# Patient Record
Sex: Female | Born: 1975 | Race: Black or African American | Hispanic: No | Marital: Married | Smoking: Never smoker
Health system: Southern US, Community
[De-identification: ages and names within clinical notes are randomized; demographics above are authoritative.]

## PROBLEM LIST (undated history)

## (undated) ENCOUNTER — Inpatient Hospital Stay (HOSPITAL_COMMUNITY): Payer: Self-pay

## (undated) DIAGNOSIS — K219 Gastro-esophageal reflux disease without esophagitis: Secondary | ICD-10-CM

## (undated) DIAGNOSIS — D649 Anemia, unspecified: Secondary | ICD-10-CM

## (undated) DIAGNOSIS — D219 Benign neoplasm of connective and other soft tissue, unspecified: Secondary | ICD-10-CM

## (undated) DIAGNOSIS — R51 Headache: Secondary | ICD-10-CM

## (undated) DIAGNOSIS — R519 Headache, unspecified: Secondary | ICD-10-CM

## (undated) HISTORY — PX: ABDOMINAL HYSTERECTOMY: SHX81

## (undated) HISTORY — DX: Headache, unspecified: R51.9

## (undated) HISTORY — PX: WISDOM TOOTH EXTRACTION: SHX21

## (undated) HISTORY — DX: Headache: R51

## (undated) HISTORY — PX: NO PAST SURGERIES: SHX2092

---

## 2001-05-12 ENCOUNTER — Other Ambulatory Visit: Admission: RE | Admit: 2001-05-12 | Discharge: 2001-05-12 | Payer: Self-pay | Admitting: Family Medicine

## 2003-05-02 ENCOUNTER — Other Ambulatory Visit: Admission: RE | Admit: 2003-05-02 | Discharge: 2003-05-02 | Payer: Self-pay | Admitting: Obstetrics & Gynecology

## 2004-06-17 ENCOUNTER — Ambulatory Visit: Payer: Self-pay | Admitting: Family Medicine

## 2004-07-08 ENCOUNTER — Ambulatory Visit: Payer: Self-pay | Admitting: Family Medicine

## 2004-12-26 ENCOUNTER — Emergency Department: Payer: Self-pay | Admitting: Unknown Physician Specialty

## 2005-12-04 ENCOUNTER — Emergency Department (HOSPITAL_COMMUNITY): Admission: EM | Admit: 2005-12-04 | Discharge: 2005-12-04 | Payer: Self-pay | Admitting: Family Medicine

## 2008-04-13 ENCOUNTER — Ambulatory Visit: Payer: Self-pay | Admitting: Gastroenterology

## 2008-04-16 ENCOUNTER — Telehealth: Payer: Self-pay | Admitting: Gastroenterology

## 2009-06-12 ENCOUNTER — Ambulatory Visit: Payer: Self-pay | Admitting: Family Medicine

## 2009-11-25 ENCOUNTER — Emergency Department: Payer: Self-pay | Admitting: Emergency Medicine

## 2012-07-16 ENCOUNTER — Ambulatory Visit: Payer: Self-pay | Admitting: Internal Medicine

## 2013-07-08 ENCOUNTER — Encounter (HOSPITAL_COMMUNITY): Payer: Self-pay | Admitting: *Deleted

## 2013-07-08 ENCOUNTER — Inpatient Hospital Stay (HOSPITAL_COMMUNITY)
Admission: AD | Admit: 2013-07-08 | Discharge: 2013-07-08 | Disposition: A | Discharge status: Home / Self Care | Payer: 59 | Attending: Obstetrics & Gynecology | Admitting: Obstetrics & Gynecology

## 2013-07-08 DIAGNOSIS — O200 Threatened abortion: Secondary | ICD-10-CM | POA: Diagnosis present

## 2013-07-08 HISTORY — DX: Anemia, unspecified: D64.9

## 2013-07-08 LAB — POCT PREGNANCY, URINE: Preg Test, Ur: POSITIVE — AB

## 2013-07-08 LAB — URINALYSIS, ROUTINE W REFLEX MICROSCOPIC
Ketones, ur: NEGATIVE mg/dL
Leukocytes, UA: NEGATIVE
Nitrite: NEGATIVE
Protein, ur: NEGATIVE mg/dL
Specific Gravity, Urine: 1.025 (ref 1.005–1.030)
pH: 5.5 (ref 5.0–8.0)

## 2013-07-08 LAB — HCG, QUANTITATIVE, PREGNANCY: hCG, Beta Chain, Quant, S: 85 m[IU]/mL — ABNORMAL HIGH (ref <5)

## 2013-07-08 LAB — ABO/RH: ABO/RH(D): O POS

## 2013-07-08 LAB — URINE MICROSCOPIC-ADD ON

## 2013-07-08 MED ORDER — ACETAMINOPHEN 500 MG PO TABS
1000.0000 mg | ORAL_TABLET | ORAL | Status: AC
  Administered 2013-07-08: 1000 mg via ORAL
  Filled 2013-07-08: qty 2

## 2013-07-08 NOTE — MAU Provider Note (Signed)
  History     CSN: [id number]  Arrival date and time: 07/08/13 0909 RN call at 0959 CNM and SNM to see pt at 1015   None     Chief Complaint  Patient presents with  . Vaginal Bleeding   HPI  Brown spotting last night.   BRB with cramping starting at 0230.  Last intercourse 3 days ago.  Confirmed SPT at [facility] last week.    Past Medical History  Diagnosis Date  . Anemia     Past Surgical History  Procedure Laterality Date  . No past surgeries      Family History  Problem Relation Age of Onset  . Hypertension Father   . Diabetes Sister   . Diabetes Maternal Grandmother   . Diabetes Maternal Grandfather   . Diabetes Paternal Grandmother   . Hypertension Paternal Grandmother   . Diabetes Paternal Grandfather     History  Substance Use Topics  . Smoking status: Never Smoker   . Smokeless tobacco: Not on file  . Alcohol Use: No    Allergies: No Known Allergies  Prescriptions prior to admission  Medication Sig Dispense Refill  . ibuprofen (ADVIL,MOTRIN) 200 MG tablet Take 200 mg by mouth every 6 (six) hours as needed for pain.      . Prenatal Vit-Fe Fumarate-FA (PRENATAL MULTIVITAMIN) TABS tablet Take 1 tablet by mouth daily at 12 noon.        ROS Pt feeling ko.  Not dizzy or light headed.   Vaginal bleeding--Brown transitioned to BRB this am with clots.  No abd pain but reports menstrual cramping.   No N/V, urinary pain. Not taken anything for pain. Physical Exam   Blood pressure 139/91, pulse 72, temperature 98.6 F (37 C), temperature source Oral, resp. rate 18.  Physical Exam A&Ox3 Abd: Soft, nontender, nondistended Vaginal: Moderate amt of BRB in vaginal vault  Uterus: No enlargement, mildly tender Cervix: Os slightly open, active bleeding Adnexa: Nontender, no masses   MAU Course  Procedures Quant--85 Type and Rh--O+   Assessment and Plan  First trimester bleeding consistent with Threatened AB  Discussed SAB, anticipatory guidance,  analgesia for cramping and signs to call clinic.  F/U Monday at 0800 for repeat quant.   Cyndee Brightly 07/08/2013, 10:19 AM

## 2013-07-08 NOTE — MAU Provider Note (Signed)
Seen with student - agree with exam and management.  Marlinda Mike CNM MSN [facility]

## 2013-07-08 NOTE — MAU Note (Signed)
C/o spotting yesterday (brownish and pinkish) @ 1700; c/o today of small red bleeding;

## 2013-11-20 ENCOUNTER — Ambulatory Visit: Payer: Self-pay | Admitting: Gastroenterology

## 2013-11-27 LAB — PATHOLOGY REPORT

## 2014-03-05 ENCOUNTER — Encounter (HOSPITAL_COMMUNITY): Payer: Self-pay

## 2014-04-02 ENCOUNTER — Encounter: Payer: Self-pay | Admitting: Internal Medicine

## 2014-04-02 ENCOUNTER — Ambulatory Visit (INDEPENDENT_AMBULATORY_CARE_PROVIDER_SITE_OTHER): Payer: 59 | Admitting: Internal Medicine

## 2014-04-02 VITALS — BP 114/76 | HR 86 | Temp 98.1°F | Ht 67.75 in | Wt 238.0 lb

## 2014-04-02 DIAGNOSIS — Z0000 Encounter for general adult medical examination without abnormal findings: Secondary | ICD-10-CM

## 2014-04-02 DIAGNOSIS — E669 Obesity, unspecified: Secondary | ICD-10-CM | POA: Insufficient documentation

## 2014-04-02 NOTE — Progress Notes (Signed)
HPI  Pt presents to the clinic today to establish care. She has not had a PCP in many years. She has no concerns today.  Flu: never Tetanus: less than 10 years ago LMP: 03/15/14 Pap Smear: 12/2012 Dentist: biannually  Past Medical History  Diagnosis Date  . Anemia   . Frequent headaches     Current Outpatient Prescriptions  Medication Sig Dispense Refill  . ibuprofen (ADVIL,MOTRIN) 200 MG tablet Take 200 mg by mouth every 6 (six) hours as needed for pain.       No current facility-administered medications for this visit.    No Known Allergies  Family History  Problem Relation Age of Onset  . Hypertension Father   . Diabetes Sister   . Hypertension Sister   . Diabetes Maternal Grandmother   . Diabetes Maternal Grandfather   . Diabetes Paternal Grandmother   . Hypertension Paternal Grandmother   . Diabetes Paternal Grandfather   . Hypertension Paternal Grandfather     History   Social History  . Marital Status: Single    Spouse Name: N/A    Number of Children: N/A  . Years of Education: N/A   Occupational History  . Not on file.   Social History Main Topics  . Smoking status: Never Smoker   . Smokeless tobacco: Never Used  . Alcohol Use: Yes     Comment: occasional  . Drug Use: No  . Sexual Activity: Not on file   Other Topics Concern  . Not on file   Social History Narrative  . No narrative on file    ROS:  Constitutional: Denies fever, malaise, fatigue, headache or abrupt weight changes.  HEENT: Denies eye pain, eye redness, ear pain, ringing in the ears, wax buildup, runny nose, nasal congestion, bloody nose, or sore throat. Respiratory: Denies difficulty breathing, shortness of breath, cough or sputum production.   Cardiovascular: Denies chest pain, chest tightness, palpitations or swelling in the hands or feet.  Gastrointestinal: Pt reports constipation. Denies abdominal pain, bloating, diarrhea or blood in the stool.  GU: Pt reports clots with  periods. Denies frequency, urgency, pain with urination, blood in urine, odor or discharge. Musculoskeletal: Denies decrease in range of motion, difficulty with gait, muscle pain or joint pain and swelling.  Skin: Denies redness, rashes, lesions or ulcercations.  Neurological: Denies dizziness, difficulty with memory, difficulty with speech or problems with balance and coordination.   No other specific complaints in a complete review of systems (except as listed in HPI above).  PE:  BP 114/76  Pulse 86  Temp(Src) 98.1 F (36.7 C) (Oral)  Ht 5' 7.75" (1.721 m)  Wt 238 lb (107.956 kg)  BMI 36.45 kg/m2  SpO2 99%  LMP 03/15/2014  Breastfeeding? Unknown Wt Readings from Last 3 Encounters:  04/02/14 238 lb (107.956 kg)    General: Appears her stated age, obese but  well developed, well nourished in NAD. HEENT: Head: normal shape and size; Eyes: sclera white, no icterus, conjunctiva pink, PERRLA and EOMs intact; Ears: Tm's gray and intact, normal light reflex; Nose: mucosa pink and moist, septum midline; Throat/Mouth: Teeth present, mucosa pink and moist, no lesions or ulcerations noted.  Neck: Normal range of motion. Neck supple, trachea midline. No massses, lumps or thyromegaly present.  Cardiovascular: Normal rate and rhythm. S1,S2 noted.  No murmur, rubs or gallops noted. No JVD or BLE edema. No carotid bruits noted. Pulmonary/Chest: Normal effort and positive vesicular breath sounds. No respiratory distress. No wheezes, rales or ronchi  noted.  Abdomen: Soft and nontender. Normal bowel sounds, no bruits noted. No distention or masses noted. Liver, spleen and kidneys non palpable. Musculoskeletal: Normal range of motion. No signs of joint swelling. No difficulty with gait.  Neurological: Alert and oriented. Cranial nerves II-XII intact. Coordination normal. +DTRs bilaterally. Psychiatric: Mood and affect normal. Behavior is normal. Judgment and thought content normal.    Assessment and  Plan:  Preventative Health Maintenace:  Encouraged her to work on diet and exercise Will check basic labs today Discussed going to gyn to r/o fibroids- she is not in any pain at this time so she will hold off for now  RTC in 1 year or sooner if needed

## 2014-04-02 NOTE — Patient Instructions (Addendum)
Preventive Care for Adults A healthy lifestyle and preventive care can promote health and wellness. Preventive health guidelines for women include the following key practices.  A routine yearly physical is a good way to check with your health care provider about your health and preventive screening. It is a chance to share any concerns and updates on your health and to receive a thorough exam.  Visit your dentist for a routine exam and preventive care every 6 months. Brush your teeth twice a day and floss once a day. Good oral hygiene prevents tooth decay and gum disease.  The frequency of eye exams is based on your age, health, family medical history, use of contact lenses, and other factors. Follow your health care provider's recommendations for frequency of eye exams.  Eat a healthy diet. Foods like vegetables, fruits, whole grains, low-fat dairy products, and lean protein foods contain the nutrients you need without too many calories. Decrease your intake of foods high in solid fats, added sugars, and salt. Eat the right amount of calories for you.Get information about a proper diet from your health care provider, if necessary.  Regular physical exercise is one of the most important things you can do for your health. Most adults should get at least 150 minutes of moderate-intensity exercise (any activity that increases your heart rate and causes you to sweat) each week. In addition, most adults need muscle-strengthening exercises on 2 or more days a week.  Maintain a healthy weight. The body mass index (BMI) is a screening tool to identify possible weight problems. It provides an estimate of body fat based on height and weight. Your health care provider can find your BMI, and can help you achieve or maintain a healthy weight.For adults 20 years and older:  A BMI below 18.5 is considered underweight.  A BMI of 18.5 to 24.9 is normal.  A BMI of 25 to 29.9 is considered overweight.  A BMI of  30 and above is considered obese.  Maintain normal blood lipids and cholesterol levels by exercising and minimizing your intake of saturated fat. Eat a balanced diet with plenty of fruit and vegetables. Blood tests for lipids and cholesterol should begin at age 52 and be repeated every 5 years. If your lipid or cholesterol levels are high, you are over 50, or you are at high risk for heart disease, you may need your cholesterol levels checked more frequently.Ongoing high lipid and cholesterol levels should be treated with medicines if diet and exercise are not working.  If you smoke, find out from your health care provider how to quit. If you do not use tobacco, do not start.  Lung cancer screening is recommended for adults aged 37-80 years who are at high risk for developing lung cancer because of a history of smoking. A yearly low-dose CT scan of the lungs is recommended for people who have at least a 30-pack-year history of smoking and are a current smoker or have quit within the past 15 years. A pack year of smoking is smoking an average of 1 pack of cigarettes a day for 1 year (for example: 1 pack a day for 30 years or 2 packs a day for 15 years). Yearly screening should continue until the smoker has stopped smoking for at least 15 years. Yearly screening should be stopped for people who develop a health problem that would prevent them from having lung cancer treatment.  If you are pregnant, do not drink alcohol. If you are breastfeeding,  be very cautious about drinking alcohol. If you are not pregnant and choose to drink alcohol, do not have more than 1 drink per day. One drink is considered to be 12 ounces (355 mL) of beer, 5 ounces (148 mL) of wine, or 1.5 ounces (44 mL) of liquor.  Avoid use of street drugs. Do not share needles with anyone. Ask for help if you need support or instructions about stopping the use of drugs.  High blood pressure causes heart disease and increases the risk of  stroke. Your blood pressure should be checked at least every 1 to 2 years. Ongoing high blood pressure should be treated with medicines if weight loss and exercise do not work.  If you are 3-86 years old, ask your health care provider if you should take aspirin to prevent strokes.  Diabetes screening involves taking a blood sample to check your fasting blood sugar level. This should be done once every 3 years, after age 67, if you are within normal weight and without risk factors for diabetes. Testing should be considered at a younger age or be carried out more frequently if you are overweight and have at least 1 risk factor for diabetes.  Breast cancer screening is essential preventive care for women. You should practice "breast self-awareness." This means understanding the normal appearance and feel of your breasts and may include breast self-examination. Any changes detected, no matter how small, should be reported to a health care provider. Women in their 8s and 30s should have a clinical breast exam (CBE) by a health care provider as part of a regular health exam every 1 to 3 years. After age 70, women should have a CBE every year. Starting at age 25, women should consider having a mammogram (breast X-ray test) every year. Women who have a family history of breast cancer should talk to their health care provider about genetic screening. Women at a high risk of breast cancer should talk to their health care providers about having an MRI and a mammogram every year.  Breast cancer gene (BRCA)-related cancer risk assessment is recommended for women who have family members with BRCA-related cancers. BRCA-related cancers include breast, ovarian, tubal, and peritoneal cancers. Having family members with these cancers may be associated with an increased risk for harmful changes (mutations) in the breast cancer genes BRCA1 and BRCA2. Results of the assessment will determine the need for genetic counseling and  BRCA1 and BRCA2 testing.  Routine pelvic exams to screen for cancer are no longer recommended for nonpregnant women who are considered low risk for cancer of the pelvic organs (ovaries, uterus, and vagina) and who do not have symptoms. Ask your health care provider if a screening pelvic exam is right for you.  If you have had past treatment for cervical cancer or a condition that could lead to cancer, you need Pap tests and screening for cancer for at least 20 years after your treatment. If Pap tests have been discontinued, your risk factors (such as having a new sexual partner) need to be reassessed to determine if screening should be resumed. Some women have medical problems that increase the chance of getting cervical cancer. In these cases, your health care provider may recommend more frequent screening and Pap tests.  The HPV test is an additional test that may be used for cervical cancer screening. The HPV test looks for the virus that can cause the cell changes on the cervix. The cells collected during the Pap test can be  tested for HPV. The HPV test could be used to screen women aged 47 years and older, and should be used in women of any age who have unclear Pap test results. After the age of 36, women should have HPV testing at the same frequency as a Pap test.  Colorectal cancer can be detected and often prevented. Most routine colorectal cancer screening begins at the age of 38 years and continues through age 58 years. However, your health care provider may recommend screening at an earlier age if you have risk factors for colon cancer. On a yearly basis, your health care provider may provide home test kits to check for hidden blood in the stool. Use of a small camera at the end of a tube, to directly examine the colon (sigmoidoscopy or colonoscopy), can detect the earliest forms of colorectal cancer. Talk to your health care provider about this at age 64, when routine screening begins. Direct  exam of the colon should be repeated every 5-10 years through age 21 years, unless early forms of pre-cancerous polyps or small growths are found.  People who are at an increased risk for hepatitis B should be screened for this virus. You are considered at high risk for hepatitis B if:  You were born in a country where hepatitis B occurs often. Talk with your health care provider about which countries are considered high risk.  Your parents were born in a high-risk country and you have not received a shot to protect against hepatitis B (hepatitis B vaccine).  You have HIV or AIDS.  You use needles to inject street drugs.  You live with, or have sex with, someone who has Hepatitis B.  You get hemodialysis treatment.  You take certain medicines for conditions like cancer, organ transplantation, and autoimmune conditions.  Hepatitis C blood testing is recommended for all people born from [birth year] through 1965 and any individual with known risks for hepatitis C.  Practice safe sex. Use condoms and avoid high-risk sexual practices to reduce the spread of sexually transmitted infections (STIs). STIs include gonorrhea, chlamydia, syphilis, trichomonas, herpes, HPV, and human immunodeficiency virus (HIV). Herpes, HIV, and HPV are viral illnesses that have no cure. They can result in disability, cancer, and death.  You should be screened for sexually transmitted illnesses (STIs) including gonorrhea and chlamydia if:  You are sexually active and are younger than 24 years.  You are older than 24 years and your health care provider tells you that you are at risk for this type of infection.  Your sexual activity has changed since you were last screened and you are at an increased risk for chlamydia or gonorrhea. Ask your health care provider if you are at risk.  If you are at risk of being infected with HIV, it is recommended that you take a prescription medicine daily to prevent HIV infection. This is  called preexposure prophylaxis (PrEP). You are considered at risk if:  You are a heterosexual woman, are sexually active, and are at increased risk for HIV infection.  You take drugs by injection.  You are sexually active with a partner who has HIV.  Talk with your health care provider about whether you are at high risk of being infected with HIV. If you choose to begin PrEP, you should first be tested for HIV. You should then be tested every 3 months for as long as you are taking PrEP.  Osteoporosis is a disease in which the bones lose minerals and strength  with aging. This can result in serious bone fractures or breaks. The risk of osteoporosis can be identified using a bone density scan. Women ages 65 years and over and women at risk for fractures or osteoporosis should discuss screening with their health care providers. Ask your health care provider whether you should take a calcium supplement or vitamin D to reduce the rate of osteoporosis.  Menopause can be associated with physical symptoms and risks. Hormone replacement therapy is available to decrease symptoms and risks. You should talk to your health care provider about whether hormone replacement therapy is right for you.  Use sunscreen. Apply sunscreen liberally and repeatedly throughout the day. You should seek shade when your shadow is shorter than you. Protect yourself by wearing long sleeves, pants, a wide-brimmed hat, and sunglasses year round, whenever you are outdoors.  Once a month, do a whole body skin exam, using a mirror to look at the skin on your back. Tell your health care provider of new moles, moles that have irregular borders, moles that are larger than a pencil eraser, or moles that have changed in shape or color.  Stay current with required vaccines (immunizations).  Influenza vaccine. All adults should be immunized every year.  Tetanus, diphtheria, and acellular pertussis (Td, Tdap) vaccine. Pregnant women should  receive 1 dose of Tdap vaccine during each pregnancy. The dose should be obtained regardless of the length of time since the last dose. Immunization is preferred during the 27th-36th week of gestation. An adult who has not previously received Tdap or who does not know her vaccine status should receive 1 dose of Tdap. This initial dose should be followed by tetanus and diphtheria toxoids (Td) booster doses every 10 years. Adults with an unknown or incomplete history of completing a 3-dose immunization series with Td-containing vaccines should begin or complete a primary immunization series including a Tdap dose. Adults should receive a Td booster every 10 years.  Varicella vaccine. An adult without evidence of immunity to varicella should receive 2 doses or a second dose if she has previously received 1 dose. Pregnant females who do not have evidence of immunity should receive the first dose after pregnancy. This first dose should be obtained before leaving the health care facility. The second dose should be obtained 4-8 weeks after the first dose.  Human papillomavirus (HPV) vaccine. Females aged 13-26 years who have not received the vaccine previously should obtain the 3-dose series. The vaccine is not recommended for use in pregnant females. However, pregnancy testing is not needed before receiving a dose. If a female is found to be pregnant after receiving a dose, no treatment is needed. In that case, the remaining doses should be delayed until after the pregnancy. Immunization is recommended for any person with an immunocompromised condition through the age of 26 years if she did not get any or all doses earlier. During the 3-dose series, the second dose should be obtained 4-8 weeks after the first dose. The third dose should be obtained 24 weeks after the first dose and 16 weeks after the second dose.  Zoster vaccine. One dose is recommended for adults aged 60 years or older unless certain conditions are  present.  Measles, mumps, and rubella (MMR) vaccine. Adults born before 1957 generally are considered immune to measles and mumps. Adults born in 1957 or later should have 1 or more doses of MMR vaccine unless there is a contraindication to the vaccine or there is laboratory evidence of immunity to   each of the three diseases. A routine second dose of MMR vaccine should be obtained at least 28 days after the first dose for students attending postsecondary schools, health care workers, or international travelers. People who received inactivated measles vaccine or an unknown type of measles vaccine during 1963-1967 should receive 2 doses of MMR vaccine. People who received inactivated mumps vaccine or an unknown type of mumps vaccine before 1979 and are at high risk for mumps infection should consider immunization with 2 doses of MMR vaccine. For females of childbearing age, rubella immunity should be determined. If there is no evidence of immunity, females who are not pregnant should be vaccinated. If there is no evidence of immunity, females who are pregnant should delay immunization until after pregnancy. Unvaccinated health care workers born before 1957 who lack laboratory evidence of measles, mumps, or rubella immunity or laboratory confirmation of disease should consider measles and mumps immunization with 2 doses of MMR vaccine or rubella immunization with 1 dose of MMR vaccine.  Pneumococcal 13-valent conjugate (PCV13) vaccine. When indicated, a person who is uncertain of her immunization history and has no record of immunization should receive the PCV13 vaccine. An adult aged 19 years or older who has certain medical conditions and has not been previously immunized should receive 1 dose of PCV13 vaccine. This PCV13 should be followed with a dose of pneumococcal polysaccharide (PPSV23) vaccine. The PPSV23 vaccine dose should be obtained at least 8 weeks after the dose of PCV13 vaccine. An adult aged 19  years or older who has certain medical conditions and previously received 1 or more doses of PPSV23 vaccine should receive 1 dose of PCV13. The PCV13 vaccine dose should be obtained 1 or more years after the last PPSV23 vaccine dose.  Pneumococcal polysaccharide (PPSV23) vaccine. When PCV13 is also indicated, PCV13 should be obtained first. All adults aged 65 years and older should be immunized. An adult younger than age 65 years who has certain medical conditions should be immunized. Any person who resides in a nursing home or long-term care facility should be immunized. An adult smoker should be immunized. People with an immunocompromised condition and certain other conditions should receive both PCV13 and PPSV23 vaccines. People with human immunodeficiency virus (HIV) infection should be immunized as soon as possible after diagnosis. Immunization during chemotherapy or radiation therapy should be avoided. Routine use of PPSV23 vaccine is not recommended for American Indians, [place] Natives, or people younger than 65 years unless there are medical conditions that require PPSV23 vaccine. When indicated, people who have unknown immunization and have no record of immunization should receive PPSV23 vaccine. One-time revaccination 5 years after the first dose of PPSV23 is recommended for people aged 19-64 years who have chronic kidney failure, nephrotic syndrome, asplenia, or immunocompromised conditions. People who received 1-2 doses of PPSV23 before age 65 years should receive another dose of PPSV23 vaccine at age 65 years or later if at least 5 years have passed since the previous dose. Doses of PPSV23 are not needed for people immunized with PPSV23 at or after age 65 years.  Meningococcal vaccine. Adults with asplenia or persistent complement component deficiencies should receive 2 doses of quadrivalent meningococcal conjugate (MenACWY-D) vaccine. The doses should be obtained at least 2 months apart.  Microbiologists working with certain meningococcal bacteria, military recruits, people at risk during an outbreak, and people who travel to or live in countries with a high rate of meningitis should be immunized. A first-year college student up through age   21 years who is living in a residence hall should receive a dose if she did not receive a dose on or after her 16th birthday. Adults who have certain high-risk conditions should receive one or more doses of vaccine.  Hepatitis A vaccine. Adults who wish to be protected from this disease, have certain high-risk conditions, work with hepatitis A-infected animals, work in hepatitis A research labs, or travel to or work in countries with a high rate of hepatitis A should be immunized. Adults who were previously unvaccinated and who anticipate close contact with an international adoptee during the first 60 days after arrival in the [place] from a country with a high rate of hepatitis A should be immunized.  Hepatitis B vaccine. Adults who wish to be protected from this disease, have certain high-risk conditions, may be exposed to blood or other infectious body fluids, are household contacts or sex partners of hepatitis B positive people, are clients or workers in certain care facilities, or travel to or work in countries with a high rate of hepatitis B should be immunized.  Haemophilus influenzae type b (Hib) vaccine. A previously unvaccinated person with asplenia or sickle cell disease or having a scheduled splenectomy should receive 1 dose of Hib vaccine. Regardless of previous immunization, a recipient of a hematopoietic stem cell transplant should receive a 3-dose series 6-12 months after her successful transplant. Hib vaccine is not recommended for adults with HIV infection. Preventive Services / Frequency Ages 43 to 39years  Blood pressure check.** / Every 1 to 2 years.  Lipid and cholesterol check.** / Every 5 years beginning at age  75.  Clinical breast exam.** / Every 3 years for women in their 32s and 74s.  BRCA-related cancer risk assessment.** / For women who have family members with a BRCA-related cancer (breast, ovarian, tubal, or peritoneal cancers).  Pap test.** / Every 2 years from ages 65 through 91. Every 3 years starting at age 34 through age 93 or 72 with a history of 3 consecutive normal Pap tests.  HPV screening.** / Every 3 years from ages 46 through ages 53 to 26 with a history of 3 consecutive normal Pap tests.  Hepatitis C blood test.** / For any individual with known risks for hepatitis C.  Skin self-exam. / Monthly.  Influenza vaccine. / Every year.  Tetanus, diphtheria, and acellular pertussis (Tdap, Td) vaccine.** / Consult your health care provider. Pregnant women should receive 1 dose of Tdap vaccine during each pregnancy. 1 dose of Td every 10 years.  Varicella vaccine.** / Consult your health care provider. Pregnant females who do not have evidence of immunity should receive the first dose after pregnancy.  HPV vaccine. / 3 doses over 6 months, if 70 and younger. The vaccine is not recommended for use in pregnant females. However, pregnancy testing is not needed before receiving a dose.  Measles, mumps, rubella (MMR) vaccine.** / You need at least 1 dose of MMR if you were born in 1957 or later. You may also need a 2nd dose. For females of childbearing age, rubella immunity should be determined. If there is no evidence of immunity, females who are not pregnant should be vaccinated. If there is no evidence of immunity, females who are pregnant should delay immunization until after pregnancy.  Pneumococcal 13-valent conjugate (PCV13) vaccine.** / Consult your health care provider.  Pneumococcal polysaccharide (PPSV23) vaccine.** / 1 to 2 doses if you smoke cigarettes or if you have certain conditions.  Meningococcal vaccine.** /  1 dose if you are age 70 to 51 years and a [place] living in a residence hall, or have one of several medical conditions, you need to get vaccinated against meningococcal disease. You may also need additional booster doses.  Hepatitis A vaccine.** / Consult your health care provider.  Hepatitis B vaccine.** / Consult your health care provider.  Haemophilus influenzae type b (Hib) vaccine.** / Consult your health care provider. Ages 40 to 64years  Blood pressure check.** / Every 1 to 2 years.  Lipid and cholesterol check.** / Every 5 years beginning at age 58 years.  Lung cancer screening. / Every year if you are aged 56-80 years and have a 30-pack-year history of smoking and currently smoke or have quit within the past 15 years. Yearly screening is stopped once you have quit smoking for at least 15 years or develop a health problem that would prevent you from having lung cancer treatment.  Clinical breast exam.** / Every year after age 35 years.  BRCA-related cancer risk assessment.** / For women who have family members with a BRCA-related cancer (breast, ovarian, tubal, or peritoneal cancers).  Mammogram.** / Every year beginning at age 109 years and continuing for as long as you are in good health. Consult with your health care provider.  Pap test.** / Every 3 years starting at age 44 years through age 94 or 70 years with a history of 3 consecutive normal Pap tests.  HPV screening.** / Every 3 years from ages 109 years through ages 50 to 30 years with a history of 3 consecutive normal Pap tests.  Fecal occult blood test (FOBT) of stool. / Every year beginning at age 73 years and continuing until age 59 years. You may not need to do this test if you get a colonoscopy every 10 years.  Flexible sigmoidoscopy or colonoscopy.** / Every 5 years for a flexible sigmoidoscopy or every 10 years for a colonoscopy beginning at age 68 years and continuing until age 12 years.  Hepatitis C blood test.** / For all people born from [birth year] through  1965 and any individual with known risks for hepatitis C.  Skin self-exam. / Monthly.  Influenza vaccine. / Every year.  Tetanus, diphtheria, and acellular pertussis (Tdap/Td) vaccine.** / Consult your health care provider. Pregnant women should receive 1 dose of Tdap vaccine during each pregnancy. 1 dose of Td every 10 years.  Varicella vaccine.** / Consult your health care provider. Pregnant females who do not have evidence of immunity should receive the first dose after pregnancy.  Zoster vaccine.** / 1 dose for adults aged 2 years or older.  Measles, mumps, rubella (MMR) vaccine.** / You need at least 1 dose of MMR if you were born in 1957 or later. You may also need a 2nd dose. For females of childbearing age, rubella immunity should be determined. If there is no evidence of immunity, females who are not pregnant should be vaccinated. If there is no evidence of immunity, females who are pregnant should delay immunization until after pregnancy.  Pneumococcal 13-valent conjugate (PCV13) vaccine.** / Consult your health care provider.  Pneumococcal polysaccharide (PPSV23) vaccine.** / 1 to 2 doses if you smoke cigarettes or if you have certain conditions.  Meningococcal vaccine.** / Consult your health care provider.  Hepatitis A vaccine.** / Consult your health care provider.  Hepatitis B vaccine.** / Consult your health care provider.  Haemophilus influenzae type b (Hib) vaccine.** / Consult your health care provider. Ages 48 years  and over  Blood pressure check.** / Every 1 to 2 years.  Lipid and cholesterol check.** / Every 5 years beginning at age 84 years.  Lung cancer screening. / Every year if you are aged 50-80 years and have a 30-pack-year history of smoking and currently smoke or have quit within the past 15 years. Yearly screening is stopped once you have quit smoking for at least 15 years or develop a health problem that would prevent you from having lung cancer  treatment.  Clinical breast exam.** / Every year after age 24 years.  BRCA-related cancer risk assessment.** / For women who have family members with a BRCA-related cancer (breast, ovarian, tubal, or peritoneal cancers).  Mammogram.** / Every year beginning at age 14 years and continuing for as long as you are in good health. Consult with your health care provider.  Pap test.** / Every 3 years starting at age 17 years through age 31 or 74 years with 3 consecutive normal Pap tests. Testing can be stopped between 65 and 70 years with 3 consecutive normal Pap tests and no abnormal Pap or HPV tests in the past 10 years.  HPV screening.** / Every 3 years from ages 30 years through ages 70 or 28 years with a history of 3 consecutive normal Pap tests. Testing can be stopped between 65 and 70 years with 3 consecutive normal Pap tests and no abnormal Pap or HPV tests in the past 10 years.  Fecal occult blood test (FOBT) of stool. / Every year beginning at age 64 years and continuing until age 92 years. You may not need to do this test if you get a colonoscopy every 10 years.  Flexible sigmoidoscopy or colonoscopy.** / Every 5 years for a flexible sigmoidoscopy or every 10 years for a colonoscopy beginning at age 73 years and continuing until age 39 years.  Hepatitis C blood test.** / For all people born from [birth year] through 1965 and any individual with known risks for hepatitis C.  Osteoporosis screening.** / A one-time screening for women ages 35 years and over and women at risk for fractures or osteoporosis.  Skin self-exam. / Monthly.  Influenza vaccine. / Every year.  Tetanus, diphtheria, and acellular pertussis (Tdap/Td) vaccine.** / 1 dose of Td every 10 years.  Varicella vaccine.** / Consult your health care provider.  Zoster vaccine.** / 1 dose for adults aged 59 years or older.  Pneumococcal 13-valent conjugate (PCV13) vaccine.** / Consult your health care provider.  Pneumococcal  polysaccharide (PPSV23) vaccine.** / 1 dose for all adults aged 8 years and older.  Meningococcal vaccine.** / Consult your health care provider.  Hepatitis A vaccine.** / Consult your health care provider.  Hepatitis B vaccine.** / Consult your health care provider.  Haemophilus influenzae type b (Hib) vaccine.** / Consult your health care provider. ** Family history and personal history of risk and conditions may change your health care provider's recommendations. Document Released: 10/27/2001 Document Revised: 09/05/2013 Document Reviewed: 01/26/2011 [facility] Patient Information 2015 [place], [place]. This information is not intended to replace advice given to you by your health care provider. Make sure you discuss any questions you have with your health care provider.

## 2014-04-02 NOTE — Progress Notes (Signed)
Pre visit review using our clinic review tool, if applicable. No additional management support is needed unless otherwise documented below in the visit note. 

## 2014-04-03 LAB — COMPREHENSIVE METABOLIC PANEL
ALBUMIN: 3.6 g/dL (ref 3.5–5.2)
ALT: 13 U/L (ref 0–35)
AST: 22 U/L (ref 0–37)
Alkaline Phosphatase: 65 U/L (ref 39–117)
BUN: 10 mg/dL (ref 6–23)
CALCIUM: 8.8 mg/dL (ref 8.4–10.5)
CHLORIDE: 104 meq/L (ref 96–112)
CO2: 25 mEq/L (ref 19–32)
Creatinine, Ser: 0.9 mg/dL (ref 0.4–1.2)
GFR: 86.66 mL/min (ref >60.00)
Glucose, Bld: 90 mg/dL (ref 70–99)
POTASSIUM: 4.1 meq/L (ref 3.5–5.1)
SODIUM: 137 meq/L (ref 135–145)
Total Bilirubin: 0.4 mg/dL (ref 0.2–1.2)
Total Protein: 7 g/dL (ref 6.0–8.3)

## 2014-04-03 LAB — LIPID PANEL
CHOL/HDL RATIO: 4
CHOLESTEROL: 130 mg/dL (ref 0–200)
HDL: 37.1 mg/dL — ABNORMAL LOW (ref >39.00)
LDL Cholesterol: 77 mg/dL (ref 0–99)
NONHDL: 92.9
Triglycerides: 78 mg/dL (ref 0.0–149.0)
VLDL: 15.6 mg/dL (ref 0.0–40.0)

## 2014-04-03 LAB — CBC
HCT: 28.5 % — ABNORMAL LOW (ref 36.0–46.0)
Hemoglobin: 9.1 g/dL — ABNORMAL LOW (ref 12.0–15.0)
MCHC: 31.7 g/dL (ref 30.0–36.0)
PLATELETS: 235 10*3/uL (ref 150.0–400.0)
RBC: 4.32 Mil/uL (ref 3.87–5.11)
RDW: 17.5 % — ABNORMAL HIGH (ref 11.5–15.5)
WBC: 6.5 10*3/uL (ref 4.0–10.5)

## 2014-04-03 LAB — HEMOGLOBIN A1C: HEMOGLOBIN A1C: 5.5 % (ref 4.6–6.5)

## 2014-04-03 LAB — TSH: TSH: 1.1 u[IU]/mL (ref 0.35–4.50)

## 2014-06-27 ENCOUNTER — Telehealth: Payer: Self-pay | Admitting: Internal Medicine

## 2014-06-27 NOTE — Telephone Encounter (Signed)
You can call do see what questions she has, if lengthy, will need OV to discuss

## 2014-06-27 NOTE — Telephone Encounter (Signed)
Ok to schedule with Dr. Glori Bickers- I also feel like we talked about possibility of fibroids and getting an ultrasound but she had declined at that visit.

## 2014-06-27 NOTE — Telephone Encounter (Signed)
Pt states she has been spotting since 06/16/2014---never having a "full" period--is having abdominal cramping--pt is worried as she had a miscarriage x 1 year ago--because space is limited i made pt an appt with Dr Glori Bickers on [place] advise if this was okay and if you would like anymore information

## 2014-06-27 NOTE — Telephone Encounter (Signed)
Pt called requesting call back. She is having irregular periods.  Call back # [phone]

## 2014-06-29 ENCOUNTER — Ambulatory Visit (INDEPENDENT_AMBULATORY_CARE_PROVIDER_SITE_OTHER): Payer: 59 | Admitting: Family Medicine

## 2014-06-29 ENCOUNTER — Encounter: Payer: Self-pay | Admitting: Family Medicine

## 2014-06-29 VITALS — BP 118/72 | HR 76 | Temp 98.1°F | Ht 67.75 in | Wt 240.5 lb

## 2014-06-29 DIAGNOSIS — N926 Irregular menstruation, unspecified: Secondary | ICD-10-CM | POA: Insufficient documentation

## 2014-06-29 NOTE — Patient Instructions (Signed)
Labs today including a serum pregnancy test  If any severe bleeding or cramping let us know

## 2014-06-29 NOTE — Progress Notes (Signed)
Subjective:    Patient ID: Jo Goodwin, female    DOB: 10/12/1975, 38 y.o.   MRN: [id number]  HPI Here with menstrual problem  Started pink spotting Oct 3rd- until now (yesterday)  Some blood when she wipes Has not done a pregnancy test  Not nauseated  Feels ok overall   Weight has not changed significantly lately    Usually her period is 7 days with first 3 heavy   Normally starts on the 4th of the month   Some stress -no more than usual  This has never happened before  Not not trying to get pregnant   Has one child - 55   Had a miscarriage in Oct last year  About 8 weeks   Patient Active Problem List   Diagnosis Date Noted  . Irregular menses 06/29/2014  . Obesity (BMI 30-39.9) 04/02/2014   Past Medical History  Diagnosis Date  . Anemia   . Frequent headaches    Past Surgical History  Procedure Laterality Date  . No past surgeries     History  Substance Use Topics  . Smoking status: Never Smoker   . Smokeless tobacco: Never Used  . Alcohol Use: Yes     Comment: occasional   Family History  Problem Relation Age of Onset  . Hypertension Father   . Diabetes Sister   . Hypertension Sister   . Diabetes Maternal Grandmother   . Diabetes Maternal Grandfather   . Diabetes Paternal Grandmother   . Hypertension Paternal Grandmother   . Diabetes Paternal Grandfather   . Hypertension Paternal Grandfather    No Known Allergies Current Outpatient Prescriptions on File Prior to Visit  Medication Sig Dispense Refill  . ibuprofen (ADVIL,MOTRIN) 200 MG tablet Take 200 mg by mouth every 6 (six) hours as needed for pain.       No current facility-administered medications on file prior to visit.    Review of Systems Review of Systems  Constitutional: Negative for fever, appetite change, fatigue and unexpected weight change.  Eyes: Negative for pain and visual disturbance.  Respiratory: Negative for cough and shortness of breath.   Cardiovascular:  Negative for cp or palpitations    Gastrointestinal: Negative for nausea, diarrhea and constipation.  Genitourinary: Negative for urgency and frequency. pos for menstrual cramping/ spotting  Skin: Negative for pallor or rash   Neurological: Negative for weakness, light-headedness, numbness and headaches.  Hematological: Negative for adenopathy. Does not bruise/bleed easily.  Psychiatric/Behavioral: Negative for dysphoric mood. The patient is not nervous/anxious.         Objective:   Physical Exam  Constitutional: She appears well-developed and well-nourished. No distress.  obese and well appearing   Eyes: Conjunctivae and EOM are normal. Pupils are equal, round, and reactive to light. No scleral icterus.  No conj pallor  Neck: Normal range of motion. Neck supple. No thyromegaly present.  Cardiovascular: Normal rate, regular rhythm and normal heart sounds.   Pulmonary/Chest: Effort normal and breath sounds normal. No respiratory distress. She has no wheezes. She has no rales.  Abdominal: Bowel sounds are normal. She exhibits no distension and no mass. There is no tenderness. There is no rebound and no guarding.  No suprapubic tenderness or fullness    Lymphadenopathy:    She has no cervical adenopathy.  Neurological: She is alert.  Skin: Skin is warm and dry. No rash noted. No pallor.  Psychiatric: She has a normal mood and affect.  Assessment & Plan:   Problem List Items Addressed This Visit     Other   Irregular menses - Primary     Spotting for much of the month- unusual  No major life changes Not using contraception  Some cramping  Disc need for PNV Will check labs incl fsh, lh, tsh, PL and serum preg  She does have a gyn    Relevant Orders      Follicle Stimulating Hormone (Completed)      Luteinizing hormone (Completed)      TSH (Completed)      Prolactin (Completed)      hCG, serum, qualitative (Completed)

## 2014-06-29 NOTE — Progress Notes (Signed)
Pre visit review using our clinic review tool, if applicable. No additional management support is needed unless otherwise documented below in the visit note. 

## 2014-06-30 LAB — FOLLICLE STIMULATING HORMONE: FSH: 6.5 m[IU]/mL

## 2014-06-30 LAB — HCG, SERUM, QUALITATIVE: Preg, Serum: NEGATIVE

## 2014-06-30 LAB — PROLACTIN: PROLACTIN: 16.2 ng/mL

## 2014-06-30 LAB — LUTEINIZING HORMONE: LH: 14.1 m[IU]/mL

## 2014-06-30 LAB — TSH: TSH: 1.083 u[IU]/mL (ref 0.350–4.500)

## 2014-07-01 NOTE — Assessment & Plan Note (Signed)
Spotting for much of the month- unusual  No major life changes Not using contraception  Some cramping  Disc need for PNV Will check labs incl fsh, lh, tsh, PL and serum preg  She does have a gyn

## 2014-07-16 ENCOUNTER — Encounter: Payer: Self-pay | Admitting: Family Medicine

## 2014-07-18 ENCOUNTER — Telehealth: Payer: Self-pay

## 2014-07-18 NOTE — Telephone Encounter (Signed)
Patient left voicemail request [facility] [address] because patient believes she has B.V. Tried calling patient back to get more information but no answer.

## 2014-07-18 NOTE — Telephone Encounter (Signed)
Pt will need OV

## 2014-07-20 ENCOUNTER — Encounter: Payer: Self-pay | Admitting: Internal Medicine

## 2014-07-20 ENCOUNTER — Ambulatory Visit (INDEPENDENT_AMBULATORY_CARE_PROVIDER_SITE_OTHER): Payer: 59 | Admitting: Internal Medicine

## 2014-07-20 VITALS — BP 118/82 | HR 66 | Temp 98.0°F | Wt 240.0 lb

## 2014-07-20 DIAGNOSIS — N760 Acute vaginitis: Secondary | ICD-10-CM

## 2014-07-20 DIAGNOSIS — A499 Bacterial infection, unspecified: Secondary | ICD-10-CM

## 2014-07-20 DIAGNOSIS — B373 Candidiasis of vulva and vagina: Secondary | ICD-10-CM

## 2014-07-20 DIAGNOSIS — B9689 Other specified bacterial agents as the cause of diseases classified elsewhere: Secondary | ICD-10-CM

## 2014-07-20 DIAGNOSIS — B3731 Acute candidiasis of vulva and vagina: Secondary | ICD-10-CM

## 2014-07-20 MED ORDER — METRONIDAZOLE 0.75 % VA GEL: 1.0000 | Freq: Two times a day (BID) | VAGINAL | Status: DC

## 2014-07-20 MED ORDER — FLUCONAZOLE 150 MG PO TABS: 150.0000 mg | ORAL_TABLET | Freq: Once | ORAL | Status: DC

## 2014-07-20 NOTE — Progress Notes (Signed)
Subjective:    Patient ID: Jo Goodwin, female    DOB: Jan 15, 1976, 38 y.o.   MRN: [id number]  HPI  Pt presents to the clinic today with c/o vaginal discharge. She reports this started 1 week ago. The discharge is clear but has a fishy odor to it. She has had some vaginal itching as well. She has not recently douched or changed soaps. She is sexually active but with one partner. They do not use protection. Her LMP was 06/15/14.  Review of Systems      Past Medical History  Diagnosis Date  . Anemia   . Frequent headaches     Current Outpatient Prescriptions  Medication Sig Dispense Refill  . ibuprofen (ADVIL,MOTRIN) 200 MG tablet Take 200 mg by mouth every 6 (six) hours as needed for pain.     No current facility-administered medications for this visit.    No Known Allergies  Family History  Problem Relation Age of Onset  . Hypertension Father   . Diabetes Sister   . Hypertension Sister   . Diabetes Maternal Grandmother   . Diabetes Maternal Grandfather   . Diabetes Paternal Grandmother   . Hypertension Paternal Grandmother   . Diabetes Paternal Grandfather   . Hypertension Paternal Grandfather     History   Social History  . Marital Status: Single    Spouse Name: N/A    Number of Children: N/A  . Years of Education: N/A   Occupational History  . Not on file.   Social History Main Topics  . Smoking status: Never Smoker   . Smokeless tobacco: Never Used  . Alcohol Use: Yes     Comment: occasional  . Drug Use: No  . Sexual Activity: Yes   Other Topics Concern  . Not on file   Social History Narrative     Constitutional: Denies fever, malaise, fatigue, headache or abrupt weight changes.  Gastrointestinal: Denies abdominal pain, bloating, constipation, diarrhea or blood in the stool.  GU: Denies urgency, frequency, pain with urination, burning sensation, blood in urine.   No other specific complaints in a complete review of systems (except as  listed in HPI above).  Objective:   Physical Exam  BP 118/82 mmHg  Pulse 66  Temp(Src) 98 F (36.7 C) (Oral)  Wt 240 lb (108.863 kg)  SpO2 98%  LMP 06/15/2014 Wt Readings from Last 3 Encounters:  07/20/14 240 lb (108.863 kg)  06/29/14 240 lb 8 oz (109.09 kg)  04/02/14 238 lb (107.956 kg)    General: Appears her stated age, obese but well developed, well nourished in NAD. Cardiovascular: Normal rate and rhythm. S1,S2 noted.  No murmur, rubs or gallops noted.  Pulmonary/Chest: Normal effort and positive vesicular breath sounds. No respiratory distress. No wheezes, rales or ronchi noted.  Abdomen: Soft and nontender. Normal bowel sounds, no bruits noted. No distention or masses noted.  Pelvic: Normal female anatomy. Small amount of thin white discharge noted at vaginal opening.   BMET    Component Value Date/Time   NA 137 04/02/2014 1552   K 4.1 04/02/2014 1552   CL 104 04/02/2014 1552   CO2 25 04/02/2014 1552   GLUCOSE 90 04/02/2014 1552   BUN 10 04/02/2014 1552   CREATININE 0.9 04/02/2014 1552   CALCIUM 8.8 04/02/2014 1552    Lipid Panel     Component Value Date/Time   CHOL 130 04/02/2014 1552   TRIG 78.0 04/02/2014 1552   HDL 37.10* 04/02/2014 1552   CHOLHDL  4 04/02/2014 1552   VLDL 15.6 04/02/2014 1552   LDLCALC 77 04/02/2014 1552    CBC    Component Value Date/Time   WBC 6.5 04/02/2014 1552   RBC 4.32 04/02/2014 1552   HGB 9.1* 04/02/2014 1552   HCT 28.5* 04/02/2014 1552   PLT 235.0 04/02/2014 1552   MCV 66.0 Repeated and verified X2.* 04/02/2014 1552   MCHC 31.7 04/02/2014 1552   RDW 17.5* 04/02/2014 1552    Hgb A1C Lab Results  Component Value Date   HGBA1C 5.5 04/02/2014         Assessment & Plan:   Bacterial Vaginosis:  Wet prep: + yeast, +clue cells, no trich eRx for Metrogel BID x 5 days  Candida Vaginitis:  Wet prep: + yeast, + clue cells, no trich eRx for Diflucan 150 mg PO x 1  Irregular Menses:  Urine HcG negative  RTC  as needed or if symptoms persist or worsen

## 2014-07-20 NOTE — Telephone Encounter (Signed)
Pt had appt today.

## 2014-07-20 NOTE — Patient Instructions (Signed)
Bacterial Vaginosis Bacterial vaginosis is a vaginal infection that occurs when the normal balance of bacteria in the vagina is disrupted. It results from an overgrowth of certain bacteria. This is the most common vaginal infection in women of childbearing age. Treatment is important to prevent complications, especially in pregnant women, as it can cause a premature delivery. CAUSES  Bacterial vaginosis is caused by an increase in harmful bacteria that are normally present in smaller amounts in the vagina. Several different kinds of bacteria can cause bacterial vaginosis. However, the reason that the condition develops is not fully understood. RISK FACTORS Certain activities or behaviors can put you at an increased risk of developing bacterial vaginosis, including:  Having a new sex partner or multiple sex partners.  Douching.  Using an intrauterine device (IUD) for contraception. Women do not get bacterial vaginosis from toilet seats, bedding, swimming pools, or contact with objects around them. SIGNS AND SYMPTOMS  Some women with bacterial vaginosis have no signs or symptoms. Common symptoms include:  Grey vaginal discharge.  A fishlike odor with discharge, especially after sexual intercourse.  Itching or burning of the vagina and vulva.  Burning or pain with urination. DIAGNOSIS  Your health care provider will take a medical history and examine the vagina for signs of bacterial vaginosis. A sample of vaginal fluid may be taken. Your health care provider will look at this sample under a microscope to check for bacteria and abnormal cells. A vaginal pH test may also be done.  TREATMENT  Bacterial vaginosis may be treated with antibiotic medicines. These may be given in the form of a pill or a vaginal cream. A second round of antibiotics may be prescribed if the condition comes back after treatment.  HOME CARE INSTRUCTIONS   Only take over-the-counter or prescription medicines as  directed by your health care provider.  If antibiotic medicine was prescribed, take it as directed. Make sure you finish it even if you start to feel better.  Do not have sex until treatment is completed.  Tell all sexual partners that you have a vaginal infection. They should see their health care provider and be treated if they have problems, such as a mild rash or itching.  Practice safe sex by using condoms and only having one sex partner. SEEK MEDICAL CARE IF:   Your symptoms are not improving after 3 days of treatment.  You have increased discharge or pain.  You have a fever. MAKE SURE YOU:   Understand these instructions.  Will watch your condition.  Will get help right away if you are not doing well or get worse. FOR MORE INFORMATION  Centers for Disease Control and Prevention, Division of STD Prevention: www.cdc.gov/std American Sexual Health Association (ASHA): www.ashastd.org  Document Released: 08/31/2005 Document Revised: 06/21/2013 Document Reviewed: 04/12/2013 [employer] Patient Information 2015 [employer]. This information is not intended to replace advice given to you by your health care provider. Make sure you discuss any questions you have with your health care provider.  

## 2014-07-20 NOTE — Progress Notes (Signed)
Pre visit review using our clinic review tool, if applicable. No additional management support is needed unless otherwise documented below in the visit note. 

## 2014-09-10 ENCOUNTER — Other Ambulatory Visit: Payer: Self-pay | Admitting: Internal Medicine

## 2014-09-10 ENCOUNTER — Telehealth: Payer: Self-pay | Admitting: Internal Medicine

## 2014-09-10 MED ORDER — METRONIDAZOLE 500 MG PO TABS: 500.0000 mg | ORAL_TABLET | Freq: Three times a day (TID) | ORAL | Status: DC

## 2014-09-10 NOTE — Telephone Encounter (Signed)
[facility], advise her that she can not drink any alcohol while taking the oral medication as it will make her very sick.

## 2014-09-10 NOTE — Telephone Encounter (Signed)
Pt called and requesting medication (pill not cream) to be called in for her bacteria vaginosis. Pt is going out of town at 12 pm today and would like something called in before she leaves.  [facility] [employer]

## 2014-09-10 NOTE — Telephone Encounter (Signed)
Pt is aware as instructed 

## 2014-11-29 ENCOUNTER — Encounter: Payer: Self-pay | Admitting: Family Medicine

## 2014-11-29 ENCOUNTER — Ambulatory Visit (INDEPENDENT_AMBULATORY_CARE_PROVIDER_SITE_OTHER): Payer: 59 | Admitting: Family Medicine

## 2014-11-29 VITALS — BP 118/80 | HR 81 | Temp 98.0°F | Ht 67.75 in | Wt 239.0 lb

## 2014-11-29 DIAGNOSIS — J069 Acute upper respiratory infection, unspecified: Secondary | ICD-10-CM

## 2014-11-29 NOTE — Progress Notes (Signed)
   Dr. Frederico Hamman T. Noa Constante, MD, [facility] [place], [address] Phone: [phone] Fax: [phone]  11/29/2014  Patient: Jo Goodwin, MRN: [id number], DOB: [birth year], 39 y.o.  Primary Physician:  Webb Silversmith, NP  Chief Complaint: URI  Subjective:   This 39 y.o. female patient presents with runny nose, sneezing, cough, sore throat, malaise and minimal / low-grade fever . Cold and congestion, over a week. Yesterday was coughing a lot. Body aching some.   Did not get flu shot this year.   ? recent exposure to others with similar symptoms.   The patent denies sore throat as the primary complaint. Denies sthortness of breath/wheezing, high fever, chest pain, rhinits for more than 14 days, significant myalgia, otalgia, facial pain, abdominal pain, changes in bowel or bladder.  PMH, PHS, Allergies, Problem List, Medications, Family History, and Social History have all been reviewed.  ROS as above, eating and drinking - tolerating PO. Urinating normally. No excessive vomitting or diarrhea. O/w as above.  Objective:   Blood pressure 118/80, pulse 81, temperature 98 F (36.7 C), temperature source Oral, height 5' 7.75" (1.721 m), weight 239 lb (108.41 kg), SpO2 98 %, unknown if currently breastfeeding.  GEN: WDWN, Non-toxic, Atraumatic, normocephalic. A and O x 3. HEENT: Oropharynx clear without exudate, MMM, no significant LAD, mild rhinnorhea Ears: TM clear, COL visualized with good landmarks CV: RRR, no m/g/r. Pulm: CTA B, no wheezes, rhonchi, or crackles, normal respiratory effort. EXT: no c/c/e Psych: well oriented, neither depressed nor anxious in appearance  Objective Data:  Assessment and Plan:   URI (upper respiratory infection)  Supportive care reviewed with patient. See patient instruction section.  Follow-up: No Follow-up on file.  New Prescriptions   No medications on file   No orders of the  defined types were placed in this encounter.    Signed,  Maud Deed. Nyheem Binette, MD   Patient's Medications  New Prescriptions   No medications on file  Previous Medications   IBUPROFEN (ADVIL,MOTRIN) 200 MG TABLET    Take 200 mg by mouth every 6 (six) hours as needed for pain.   PSEUDOEPH-DOXYLAMINE-DM-APAP (NYQUIL PO)    Take by mouth.   PSEUDOEPHEDRINE-APAP-DM (DAYQUIL PO)    Take by mouth.  Modified Medications   No medications on file  Discontinued Medications   FLUCONAZOLE (DIFLUCAN) 150 MG TABLET    Take 1 tablet (150 mg total) by mouth once.   METRONIDAZOLE (FLAGYL) 500 MG TABLET    Take 1 tablet (500 mg total) by mouth 3 (three) times daily.

## 2014-11-29 NOTE — Progress Notes (Signed)
Pre visit review using our clinic review tool, if applicable. No additional management support is needed unless otherwise documented below in the visit note. 

## 2014-12-06 ENCOUNTER — Ambulatory Visit (INDEPENDENT_AMBULATORY_CARE_PROVIDER_SITE_OTHER): Payer: 59 | Admitting: Internal Medicine

## 2014-12-06 ENCOUNTER — Encounter: Payer: Self-pay | Admitting: Internal Medicine

## 2014-12-06 VITALS — BP 122/80 | HR 76 | Temp 98.8°F | Wt 240.0 lb

## 2014-12-06 DIAGNOSIS — B373 Candidiasis of vulva and vagina: Secondary | ICD-10-CM | POA: Diagnosis not present

## 2014-12-06 DIAGNOSIS — B3731 Acute candidiasis of vulva and vagina: Secondary | ICD-10-CM

## 2014-12-06 MED ORDER — FLUCONAZOLE 150 MG PO TABS
150.0000 mg | ORAL_TABLET | Freq: Once | ORAL | Status: DC
Start: 2014-12-06 — End: 2015-01-24

## 2014-12-06 NOTE — Patient Instructions (Signed)

## 2014-12-06 NOTE — Progress Notes (Signed)
Pre visit review using our clinic review tool, if applicable. No additional management support is needed unless otherwise documented below in the visit note. 

## 2014-12-06 NOTE — Progress Notes (Signed)
Subjective:    Patient ID: Jo Goodwin, female    DOB: [birth year], 39 y.o.   MRN: [id number]  HPI  Pt presents to the clinic today with c/o vaginal discharge, itching and odor. She reports this started 10 days ago. She denies any abdominal pain. She has tried some leftover Flagyl with some relief. She is sexually active. Her LMP was 11/16/14. She has not douched or taken bubble baths. She did recently change her soap.  Review of Systems      Past Medical History  Diagnosis Date  . Anemia   . Frequent headaches     Current Outpatient Prescriptions  Medication Sig Dispense Refill  . ibuprofen (ADVIL,MOTRIN) 200 MG tablet Take 200 mg by mouth every 6 (six) hours as needed for pain.    . Pseudoeph-Doxylamine-DM-APAP (NYQUIL PO) Take by mouth.    . Pseudoephedrine-APAP-DM (DAYQUIL PO) Take by mouth.     No current facility-administered medications for this visit.    No Known Allergies  Family History  Problem Relation Age of Onset  . Hypertension Father   . Diabetes Sister   . Hypertension Sister   . Diabetes Maternal Grandmother   . Diabetes Maternal Grandfather   . Diabetes Paternal Grandmother   . Hypertension Paternal Grandmother   . Diabetes Paternal Grandfather   . Hypertension Paternal Grandfather     History   Social History  . Marital Status: Single    Spouse Name: N/A  . Number of Children: N/A  . Years of Education: N/A   Occupational History  . Not on file.   Social History Main Topics  . Smoking status: Never Smoker   . Smokeless tobacco: Never Used  . Alcohol Use: Yes     Comment: occasional  . Drug Use: No  . Sexual Activity: Yes   Other Topics Concern  . Not on file   Social History Narrative     Constitutional: Denies fever, malaise, fatigue, headache or abrupt weight changes.  Respiratory: Denies difficulty breathing, shortness of breath, cough or sputum production.   Cardiovascular: Denies chest pain, chest tightness,  palpitations or swelling in the hands or feet.  Gastrointestinal: Denies abdominal pain, bloating, constipation, diarrhea or blood in the stool.  GU: Pt reports vaginal discharge and odor. Denies urgency, frequency, pain with urination, burning sensation, blood in urine.  No other specific complaints in a complete review of systems (except as listed in HPI above).  Objective:   Physical Exam  BP 122/80 mmHg  Pulse 76  Temp(Src) 98.8 F (37.1 C) (Oral)  Wt 240 lb (108.863 kg)  SpO2 99%  LMP 11/16/2014 Wt Readings from Last 3 Encounters:  12/06/14 240 lb (108.863 kg)  11/29/14 239 lb (108.41 kg)  07/20/14 240 lb (108.863 kg)    General: Appears her stated age, well developed, well nourished in NAD. Skin: Warm, dry and intact. No rashes, lesions or ulcerations noted. Cardiovascular: Normal rate and rhythm. S1,S2 noted.  No murmur, rubs or gallops noted.  Pulmonary/Chest: Normal effort and positive vesicular breath sounds. No respiratory distress. No wheezes, rales or ronchi noted.  Abdomen: Soft and nontender. Normal bowel sounds, no bruits noted. No distention or masses noted. Liver, spleen and kidneys non palpable. Pelvic: Normal female anatomy. Thick white discharge noted at the vaginal opening. No irritation noted. Cystocele noted.  BMET    Component Value Date/Time   NA 137 04/02/2014 1552   K 4.1 04/02/2014 1552   CL 104 04/02/2014 1552  CO2 25 04/02/2014 1552   GLUCOSE 90 04/02/2014 1552   BUN 10 04/02/2014 1552   CREATININE 0.9 04/02/2014 1552   CALCIUM 8.8 04/02/2014 1552    Lipid Panel     Component Value Date/Time   CHOL 130 04/02/2014 1552   TRIG 78.0 04/02/2014 1552   HDL 37.10* 04/02/2014 1552   CHOLHDL 4 04/02/2014 1552   VLDL 15.6 04/02/2014 1552   LDLCALC 77 04/02/2014 1552    CBC    Component Value Date/Time   WBC 6.5 04/02/2014 1552   RBC 4.32 04/02/2014 1552   HGB 9.1* 04/02/2014 1552   HCT 28.5* 04/02/2014 1552   PLT 235.0 04/02/2014 1552    MCV 66.0 Repeated and verified X2.* 04/02/2014 1552   MCHC 31.7 04/02/2014 1552   RDW 17.5* 04/02/2014 1552    Hgb A1C Lab Results  Component Value Date   HGBA1C 5.5 04/02/2014         Assessment & Plan:   Candida Vaginitis:  Wet prep: + yeast, - clue, - trich Advised her to use a soap made for sensitive skin eRx for Diflucan 150 mg PO x 1, may repeat in 3 days if needed  RTC as needed or if symptoms persist or worsen

## 2015-01-24 ENCOUNTER — Telehealth: Payer: Self-pay | Admitting: Radiology

## 2015-01-24 ENCOUNTER — Ambulatory Visit (INDEPENDENT_AMBULATORY_CARE_PROVIDER_SITE_OTHER): Payer: 59 | Admitting: Internal Medicine

## 2015-01-24 ENCOUNTER — Encounter: Payer: Self-pay | Admitting: Internal Medicine

## 2015-01-24 VITALS — BP 112/80 | HR 90 | Temp 98.6°F | Wt 239.0 lb

## 2015-01-24 DIAGNOSIS — R002 Palpitations: Secondary | ICD-10-CM

## 2015-01-24 DIAGNOSIS — R5383 Other fatigue: Secondary | ICD-10-CM

## 2015-01-24 DIAGNOSIS — R42 Dizziness and giddiness: Secondary | ICD-10-CM

## 2015-01-24 DIAGNOSIS — G44201 Tension-type headache, unspecified, intractable: Secondary | ICD-10-CM

## 2015-01-24 DIAGNOSIS — H9313 Tinnitus, bilateral: Secondary | ICD-10-CM | POA: Diagnosis not present

## 2015-01-24 LAB — CBC WITH DIFFERENTIAL/PLATELET
Basophils Absolute: 0 10*3/uL (ref 0.0–0.1)
Basophils Relative: 0.6 % (ref 0.0–3.0)
EOS ABS: 0.2 10*3/uL (ref 0.0–0.7)
EOS PCT: 4.1 % (ref 0.0–5.0)
Hemoglobin: 7.8 g/dL — CL (ref 12.0–15.0)
Lymphocytes Relative: 32 % (ref 12.0–46.0)
Lymphs Abs: 1.7 10*3/uL (ref 0.7–4.0)
MCHC: 30.8 g/dL (ref 30.0–36.0)
Monocytes Absolute: 0.5 10*3/uL (ref 0.1–1.0)
Monocytes Relative: 8.6 % (ref 3.0–12.0)
Neutro Abs: 2.9 10*3/uL (ref 1.4–7.7)
Neutrophils Relative %: 54.7 % (ref 43.0–77.0)
PLATELETS: 349 10*3/uL (ref 150.0–400.0)
RBC: 4.16 Mil/uL (ref 3.87–5.11)
RDW: 18.6 % — ABNORMAL HIGH (ref 11.5–15.5)
WBC: 5.3 10*3/uL (ref 4.0–10.5)

## 2015-01-24 LAB — IBC PANEL
Iron: 15 ug/dL — ABNORMAL LOW (ref 42–145)
Saturation Ratios: 3.1 % — ABNORMAL LOW (ref 20.0–50.0)
Transferrin: 343 mg/dL (ref 212.0–360.0)

## 2015-01-24 LAB — FOLATE: Folate: >23.3 ng/mL (ref >5.9)

## 2015-01-24 LAB — TSH: TSH: 0.75 u[IU]/mL (ref 0.35–4.50)

## 2015-01-24 LAB — VITAMIN D 25 HYDROXY (VIT D DEFICIENCY, FRACTURES): VITD: 21.93 ng/mL — ABNORMAL LOW (ref 30.00–100.00)

## 2015-01-24 LAB — HEMOGLOBIN A1C: HEMOGLOBIN A1C: 5.5 % (ref 4.6–6.5)

## 2015-01-24 LAB — VITAMIN B12: Vitamin B-12: 536 pg/mL (ref 211–911)

## 2015-01-24 NOTE — Progress Notes (Signed)
Pre visit review using our clinic review tool, if applicable. No additional management support is needed unless otherwise documented below in the visit note. 

## 2015-01-24 NOTE — Patient Instructions (Signed)

## 2015-01-24 NOTE — Progress Notes (Signed)
Subjective:    Patient ID: Jo Goodwin, female    DOB: Jul 29, 1976, 39 y.o.   MRN: [id number]  HPI  Pt presents to the clinic today with c/o a headache. She reports this has been gong on for a few weeks. The headache is intermittent. She reports it starts in her forehead and radiates to the back of her head. She has intermittently has some sensivitiy to light. She has had some nausea but no vomiting. She has had some dizziness and ringing in her ears. The dizziness can occur with or without the headache. She also feel short of breath, usually when walking up stairs or getting out of a hot shower. She denies chest pain but she feels like her heart is beating really fast. She has tried Ibuprfoen with some relief. She is drinking plenty of fluids. She denies changes in her diet, activity or medications. She is not sure why she is feeling this way or so fatigued. She feels like something is wrong but cannot pinpoint what it is. She has been stressed lately but denies feeling anxious or depressed.  Review of Systems      Past Medical History  Diagnosis Date  . Anemia   . Frequent headaches     No current outpatient prescriptions on file.   No current facility-administered medications for this visit.    No Known Allergies  Family History  Problem Relation Age of Onset  . Hypertension Father   . Diabetes Sister   . Hypertension Sister   . Diabetes Maternal Grandmother   . Diabetes Maternal Grandfather   . Diabetes Paternal Grandmother   . Hypertension Paternal Grandmother   . Diabetes Paternal Grandfather   . Hypertension Paternal Grandfather     History   Social History  . Marital Status: Single    Spouse Name: N/A  . Number of Children: N/A  . Years of Education: N/A   Occupational History  . Not on file.   Social History Main Topics  . Smoking status: Never Smoker   . Smokeless tobacco: Never Used  . Alcohol Use: Yes     Comment: occasional  . Drug Use: No    . Sexual Activity: Yes   Other Topics Concern  . Not on file   Social History Narrative     Constitutional: Pt reports fatigue and headache. Denies fever, malaise, or abrupt weight changes.  HEENT: Denies eye pain, eye redness, ear pain, ringing in the ears, wax buildup, runny nose, nasal congestion, bloody nose, or sore throat. Respiratory: Pt reports shortness of breath. Denies difficulty breathing, cough or sputum production.   Cardiovascular: Denies chest pain, chest tightness, palpitations or swelling in the hands or feet.  Gastrointestinal: Denies abdominal pain, bloating, constipation, diarrhea or blood in the stool.  Neurological: Pt reports dizziness. Denies difficulty with memory, difficulty with speech or problems with balance and coordination.  Psych: Denies anxiety, depression, SI/HI.  No other specific complaints in a complete review of systems (except as listed in HPI above).  Objective:   Physical Exam  BP 112/80 mmHg  Pulse 90  Temp(Src) 98.6 F (37 C) (Tympanic)  Wt 239 lb (108.41 kg)  SpO2 99% Wt Readings from Last 3 Encounters:  01/24/15 239 lb (108.41 kg)  12/06/14 240 lb (108.863 kg)  11/29/14 239 lb (108.41 kg)    General: Appears her stated age, obese in NAD. Skin: Warm, dry and intact. No rashes, lesions or ulcerations noted. HEENT: Head: normal shape and size,  no sinus tenderness noted; Eyes: sclera white, no icterus, conjunctiva pink, PERRLA and EOMs intact; Ears: Tm's gray and intact, normal light reflex; Throat/Mouth: Teeth present, mucosa pink and moist, no exudate, lesions or ulcerations noted.  Neck: No adenopathy noted. Neck supple, trachea midline. No masses, lumps or thyromegaly present.  Cardiovascular: Normal rate and rhythm. S1,S2 noted.  No murmur, rubs or gallops noted.  Pulmonary/Chest: Normal effort and positive vesicular breath sounds. No respiratory distress. No wheezes, rales or ronchi noted.  Neurological: Alert and oriented.  Cranial nerves II-XII grossly intact. Coordination normal.  Psychiatric: She seems slightly anxious but affect is normal. Behavior is normal. Judgment and thought content normal.     BMET    Component Value Date/Time   NA 137 04/02/2014 1552   K 4.1 04/02/2014 1552   CL 104 04/02/2014 1552   CO2 25 04/02/2014 1552   GLUCOSE 90 04/02/2014 1552   BUN 10 04/02/2014 1552   CREATININE 0.9 04/02/2014 1552   CALCIUM 8.8 04/02/2014 1552    Lipid Panel     Component Value Date/Time   CHOL 130 04/02/2014 1552   TRIG 78.0 04/02/2014 1552   HDL 37.10* 04/02/2014 1552   CHOLHDL 4 04/02/2014 1552   VLDL 15.6 04/02/2014 1552   LDLCALC 77 04/02/2014 1552    CBC    Component Value Date/Time   WBC 6.5 04/02/2014 1552   RBC 4.32 04/02/2014 1552   HGB 9.1* 04/02/2014 1552   HCT 28.5* 04/02/2014 1552   PLT 235.0 04/02/2014 1552   MCV 66.0 Repeated and verified X2.* 04/02/2014 1552   MCHC 31.7 04/02/2014 1552   RDW 17.5* 04/02/2014 1552    Hgb A1C Lab Results  Component Value Date   HGBA1C 5.5 04/02/2014         Assessment & Plan:   Headache:  This seems tension related to me Discussed stress relieving techniques Continue Ibuprofen prn  Fatigue, dizziness, shortness of breath, ringing in her ears and palpitations:  ECG today normal Exam was normal ? If this could all be related to her stress Will check CBC, CMET, A1C, TSH, Vit B12, Vit D, Folate, Iron and TIBC Will call you with all of the lab results  RTC as needed or if symptoms persist or worsen

## 2015-01-24 NOTE — Telephone Encounter (Signed)
noted 

## 2015-01-24 NOTE — Telephone Encounter (Signed)
[facility] called critical labs, HGB-7.8, results given to [facility]

## 2015-01-25 ENCOUNTER — Telehealth: Payer: Self-pay

## 2015-01-25 ENCOUNTER — Other Ambulatory Visit: Payer: Self-pay | Admitting: Internal Medicine

## 2015-01-25 MED ORDER — FERROUS SULFATE 325 (65 FE) MG PO TABS: 325.0000 mg | ORAL_TABLET | Freq: Every day | ORAL | Status: DC

## 2015-01-25 NOTE — Telephone Encounter (Signed)
Pt left v/m; pt saw note on my chart about lab results and that pt needed to take an iron pill; pt wanted to know if iron pill even thou OTC could it be sent to [facility].Please advise.

## 2015-01-25 NOTE — Telephone Encounter (Signed)
[facility] to [facility]

## 2015-12-18 ENCOUNTER — Other Ambulatory Visit: Payer: Self-pay | Admitting: Obstetrics and Gynecology

## 2015-12-19 ENCOUNTER — Encounter (HOSPITAL_COMMUNITY): Payer: Self-pay | Admitting: *Deleted

## 2015-12-20 MED ORDER — DEXTROSE 5 % IV SOLN
100.0000 mg | INTRAVENOUS | Status: AC
  Administered 2015-12-21: 100 mg via INTRAVENOUS
  Filled 2015-12-20: qty 100

## 2015-12-21 ENCOUNTER — Encounter (HOSPITAL_COMMUNITY): Payer: Self-pay | Admitting: *Deleted

## 2015-12-21 ENCOUNTER — Encounter (HOSPITAL_COMMUNITY)
Admission: AD | Disposition: A | Discharge status: Home / Self Care | Payer: Self-pay | Source: Ambulatory Visit | Attending: Obstetrics and Gynecology

## 2015-12-21 ENCOUNTER — Inpatient Hospital Stay (HOSPITAL_COMMUNITY): Payer: 59 | Admitting: Anesthesiology

## 2015-12-21 ENCOUNTER — Inpatient Hospital Stay (HOSPITAL_COMMUNITY)
Admission: AD | Admit: 2015-12-21 | Discharge: 2015-12-21 | Disposition: A | Discharge status: Home / Self Care | Payer: 59 | Source: Ambulatory Visit | Attending: Obstetrics and Gynecology | Admitting: Obstetrics and Gynecology

## 2015-12-21 DIAGNOSIS — D509 Iron deficiency anemia, unspecified: Secondary | ICD-10-CM | POA: Insufficient documentation

## 2015-12-21 DIAGNOSIS — K219 Gastro-esophageal reflux disease without esophagitis: Secondary | ICD-10-CM | POA: Diagnosis not present

## 2015-12-21 DIAGNOSIS — Z833 Family history of diabetes mellitus: Secondary | ICD-10-CM | POA: Insufficient documentation

## 2015-12-21 DIAGNOSIS — Z6837 Body mass index (BMI) 37.0-37.9, adult: Secondary | ICD-10-CM | POA: Insufficient documentation

## 2015-12-21 DIAGNOSIS — D259 Leiomyoma of uterus, unspecified: Secondary | ICD-10-CM | POA: Diagnosis not present

## 2015-12-21 DIAGNOSIS — Z8249 Family history of ischemic heart disease and other diseases of the circulatory system: Secondary | ICD-10-CM | POA: Diagnosis not present

## 2015-12-21 DIAGNOSIS — O020 Blighted ovum and nonhydatidiform mole: Secondary | ICD-10-CM

## 2015-12-21 DIAGNOSIS — O039 Complete or unspecified spontaneous abortion without complication: Secondary | ICD-10-CM

## 2015-12-21 DIAGNOSIS — O034 Incomplete spontaneous abortion without complication: Secondary | ICD-10-CM | POA: Diagnosis present

## 2015-12-21 HISTORY — PX: DILATION AND EVACUATION: SHX1459

## 2015-12-21 HISTORY — DX: Gastro-esophageal reflux disease without esophagitis: K21.9

## 2015-12-21 LAB — TYPE AND SCREEN
ABO/RH(D): O POS
ANTIBODY SCREEN: NEGATIVE

## 2015-12-21 LAB — CBC
HEMATOCRIT: 30 % — AB (ref 36.0–46.0)
Hemoglobin: 8.9 g/dL — ABNORMAL LOW (ref 12.0–15.0)
MCH: 19.8 pg — ABNORMAL LOW (ref 26.0–34.0)
MCHC: 29.7 g/dL — ABNORMAL LOW (ref 30.0–36.0)
MCV: 66.7 fL — ABNORMAL LOW (ref 78.0–100.0)
PLATELETS: 330 10*3/uL (ref 150–400)
RBC: 4.5 MIL/uL (ref 3.87–5.11)
RDW: 19.4 % — AB (ref 11.5–15.5)
WBC: 5.1 10*3/uL (ref 4.0–10.5)

## 2015-12-21 LAB — HCG, QUANTITATIVE, PREGNANCY: hCG, Beta Chain, Quant, S: 209 m[IU]/mL — ABNORMAL HIGH (ref <5)

## 2015-12-21 SURGERY — DILATION AND EVACUATION, UTERUS
Anesthesia: Monitor Anesthesia Care | Site: Vagina

## 2015-12-21 MED ORDER — FENTANYL CITRATE (PF) 100 MCG/2ML IJ SOLN
INTRAMUSCULAR | Status: DC | PRN
  Administered 2015-12-21: 100 ug via INTRAVENOUS

## 2015-12-21 MED ORDER — KETOROLAC TROMETHAMINE 30 MG/ML IJ SOLN
INTRAMUSCULAR | Status: AC
  Filled 2015-12-21: qty 2

## 2015-12-21 MED ORDER — FERROUS SULFATE 325 (65 FE) MG PO TABS: 325.0000 mg | ORAL_TABLET | Freq: Three times a day (TID) | ORAL | Status: DC

## 2015-12-21 MED ORDER — PROPOFOL 10 MG/ML IV BOLUS
INTRAVENOUS | Status: DC | PRN
  Administered 2015-12-21: 10 mg via INTRAVENOUS
  Administered 2015-12-21: 20 mg via INTRAVENOUS
  Administered 2015-12-21: 10 mg via INTRAVENOUS
  Administered 2015-12-21: 20 mg via INTRAVENOUS

## 2015-12-21 MED ORDER — LIDOCAINE HCL (CARDIAC) 20 MG/ML IV SOLN
INTRAVENOUS | Status: AC
  Filled 2015-12-21: qty 5

## 2015-12-21 MED ORDER — CHLOROPROCAINE HCL 1 % IJ SOLN
INTRAMUSCULAR | Status: DC | PRN
  Administered 2015-12-21: 20 mL

## 2015-12-21 MED ORDER — KETOROLAC TROMETHAMINE 30 MG/ML IJ SOLN
INTRAMUSCULAR | Status: DC | PRN
  Administered 2015-12-21: 30 mg via INTRAMUSCULAR
  Administered 2015-12-21: 30 mg via INTRAVENOUS

## 2015-12-21 MED ORDER — LACTATED RINGERS IV BOLUS (SEPSIS)
1000.0000 mL | Freq: Once | INTRAVENOUS | Status: AC
  Administered 2015-12-21: 1000 mL via INTRAVENOUS

## 2015-12-21 MED ORDER — FENTANYL CITRATE (PF) 100 MCG/2ML IJ SOLN: 25.0000 ug | INTRAMUSCULAR | Status: DC | PRN

## 2015-12-21 MED ORDER — IBUPROFEN 800 MG PO TABS: 800.0000 mg | ORAL_TABLET | Freq: Three times a day (TID) | ORAL | Status: DC | PRN

## 2015-12-21 MED ORDER — MIDAZOLAM HCL 2 MG/2ML IJ SOLN
INTRAMUSCULAR | Status: DC | PRN
  Administered 2015-12-21: 2 mg via INTRAVENOUS

## 2015-12-21 MED ORDER — ONDANSETRON HCL 4 MG/2ML IJ SOLN
INTRAMUSCULAR | Status: AC
  Filled 2015-12-21: qty 2

## 2015-12-21 MED ORDER — ONDANSETRON HCL 4 MG/2ML IJ SOLN
INTRAMUSCULAR | Status: DC | PRN
  Administered 2015-12-21: 4 mg via INTRAVENOUS

## 2015-12-21 MED ORDER — FENTANYL CITRATE (PF) 100 MCG/2ML IJ SOLN
INTRAMUSCULAR | Status: AC
  Filled 2015-12-21: qty 2

## 2015-12-21 MED ORDER — HYDROCODONE-ACETAMINOPHEN 7.5-325 MG PO TABS: 1.0000 | ORAL_TABLET | Freq: Once | ORAL | Status: DC | PRN

## 2015-12-21 MED ORDER — MIDAZOLAM HCL 2 MG/2ML IJ SOLN
INTRAMUSCULAR | Status: AC
  Filled 2015-12-21: qty 2

## 2015-12-21 MED ORDER — FAMOTIDINE IN NACL 20-0.9 MG/50ML-% IV SOLN
20.0000 mg | Freq: Once | INTRAVENOUS | Status: AC
  Administered 2015-12-21: 20 mg via INTRAVENOUS
  Filled 2015-12-21: qty 50

## 2015-12-21 MED ORDER — LACTATED RINGERS IV SOLN
INTRAVENOUS | Status: DC
Start: 2015-12-21 — End: 2015-12-21
  Administered 2015-12-21 (×2): via INTRAVENOUS

## 2015-12-21 MED ORDER — SODIUM CHLORIDE 0.9 % IV SOLN
510.0000 mg | Freq: Once | INTRAVENOUS | Status: AC
  Administered 2015-12-21: 510 mg via INTRAVENOUS
  Filled 2015-12-21: qty 17

## 2015-12-21 MED ORDER — SODIUM CHLORIDE 0.9 % IV SOLN
Freq: Once | INTRAVENOUS | Status: AC
  Administered 2015-12-21: 14:00:00 via INTRAVENOUS

## 2015-12-21 MED ORDER — PROPOFOL 10 MG/ML IV BOLUS
INTRAVENOUS | Status: AC
  Filled 2015-12-21: qty 20

## 2015-12-21 MED ORDER — CITRIC ACID-SODIUM CITRATE 334-500 MG/5ML PO SOLN
30.0000 mL | Freq: Once | ORAL | Status: AC
  Administered 2015-12-21: 30 mL via ORAL
  Filled 2015-12-21: qty 15

## 2015-12-21 SURGICAL SUPPLY — 18 items
CATH ROBINSON RED A/P 16FR (CATHETERS) ×3 IMPLANT
CLOTH BEACON ORANGE TIMEOUT ST (SAFETY) ×3 IMPLANT
DECANTER SPIKE VIAL GLASS SM (MISCELLANEOUS) ×3 IMPLANT
GLOVE BIOGEL PI IND STRL 7.0 (GLOVE) ×3 IMPLANT
GLOVE BIOGEL PI INDICATOR 7.0 (GLOVE) ×6
GLOVE ECLIPSE 6.5 STRL STRAW (GLOVE) ×3 IMPLANT
GOWN STRL REUS W/TWL LRG LVL3 (GOWN DISPOSABLE) ×6 IMPLANT
KIT BERKELEY 1ST TRIMESTER 3/8 (MISCELLANEOUS) ×3 IMPLANT
NS IRRIG 1000ML POUR BTL (IV SOLUTION) ×3 IMPLANT
PACK VAGINAL MINOR WOMEN LF (CUSTOM PROCEDURE TRAY) ×3 IMPLANT
PAD OB MATERNITY 4.3X12.25 (PERSONAL CARE ITEMS) ×3 IMPLANT
PAD PREP 24X48 CUFFED NSTRL (MISCELLANEOUS) ×3 IMPLANT
SET BERKELEY SUCTION TUBING (SUCTIONS) ×3 IMPLANT
TOWEL OR 17X24 6PK STRL BLUE (TOWEL DISPOSABLE) ×6 IMPLANT
VACURETTE 10 RIGID CVD (CANNULA) IMPLANT
VACURETTE 7MM CVD STRL WRAP (CANNULA) ×3 IMPLANT
VACURETTE 8 RIGID CVD (CANNULA) IMPLANT
VACURETTE 9 RIGID CVD (CANNULA) IMPLANT

## 2015-12-21 NOTE — Progress Notes (Signed)
Notes extreme fatigue Labs c/w  Microcytic hypochromic anemia Prior labs( 2014) showed iron deficiency anemia( low iron sat%) No hgb electrophoresis noted in system Lab:  CBC    Component Value Date/Time   WBC 5.1 12/21/2015 0955   RBC 4.50 12/21/2015 0955   HGB 8.9* 12/21/2015 0955   HCT 30.0* 12/21/2015 0955   PLT 330 12/21/2015 0955   MCV 66.7* 12/21/2015 0955   MCH 19.8* 12/21/2015 0955   MCHC 29.7* 12/21/2015 0955   RDW 19.4* 12/21/2015 0955   LYMPHSABS 1.7 01/24/2015 1104   MONOABS 0.5 01/24/2015 1104   EOSABS 0.2 01/24/2015 1104   BASOSABS 0.0 01/24/2015 1104     IMP: severe microcytic hypochromic anemia P) IV feraheme . Check hgb electrophoresis

## 2015-12-21 NOTE — Anesthesia Preprocedure Evaluation (Addendum)
Anesthesia Evaluation  Patient identified by MRN, date of birth, ID band Patient awake    Reviewed: Allergy & Precautions, NPO status , Patient's Chart, lab work & pertinent test results  Airway Mallampati: II  TM Distance: >3 FB Neck ROM: Full    Dental   Pulmonary neg pulmonary ROS,    breath sounds clear to auscultation       Cardiovascular negative cardio ROS   Rhythm:Regular Rate:Normal     Neuro/Psych negative neurological ROS     GI/Hepatic Neg liver ROS, GERD  ,  Endo/Other  Morbid obesity  Renal/GU negative Renal ROS     Musculoskeletal   Abdominal   Peds  Hematology  (+) anemia ,   Anesthesia Other Findings   Reproductive/Obstetrics                            Lab Results  Component Value Date   WBC 5.1 12/21/2015   HGB 8.9* 12/21/2015   HCT 30.0* 12/21/2015   MCV 66.7* 12/21/2015   PLT 330 12/21/2015    Anesthesia Physical Anesthesia Plan  ASA: III  Anesthesia Plan: MAC   Post-op Pain Management:    Induction: Intravenous  Airway Management Planned: Natural Airway and Simple Face Mask  Additional Equipment:   Intra-op Plan:   Post-operative Plan:   Informed Consent: I have reviewed the patients History and Physical, chart, labs and discussed the procedure including the risks, benefits and alternatives for the proposed anesthesia with the patient or authorized representative who has indicated his/her understanding and acceptance.     Plan Discussed with: CRNA  Anesthesia Plan Comments:        Anesthesia Quick Evaluation

## 2015-12-21 NOTE — Anesthesia Postprocedure Evaluation (Signed)
Anesthesia Post Note  Patient: Jo Goodwin  Procedure(s) Performed: Procedure(s) (LRB): DILATATION AND EVACUATION (N/A)  Patient location during evaluation: PACU Anesthesia Type: MAC Level of consciousness: awake and alert Pain management: pain level controlled Vital Signs Assessment: post-procedure vital signs reviewed and stable Respiratory status: spontaneous breathing, nonlabored ventilation, respiratory function stable and patient connected to nasal cannula oxygen Cardiovascular status: stable and blood pressure returned to baseline Anesthetic complications: no    Last Vitals:  Filed Vitals:   12/21/15 1400 12/21/15 1430  BP: 108/64 110/65  Pulse: 72 72  Temp:  36.9 C  Resp: 18 16    Last Pain:  Filed Vitals:   12/21/15 1430  PainSc: 0-No pain                 Tiajuana Amass

## 2015-12-21 NOTE — Transfer of Care (Signed)
Immediate Anesthesia Transfer of Care Note  Patient: Jo Goodwin  Procedure(s) Performed: Procedure(s): DILATATION AND EVACUATION (N/A)  Patient Location: PACU  Anesthesia Type:MAC  Level of Consciousness: sedated  Airway & Oxygen Therapy: Patient Spontanous Breathing  Post-op Assessment: Report given to RN and Post -op Vital signs reviewed and stable  Post vital signs: Reviewed and stable  Last Vitals:  Filed Vitals:   12/21/15 0944  BP: 117/71  Pulse: 71  Temp: 36.8 C  Resp: 18    Complications: No apparent anesthesia complications

## 2015-12-21 NOTE — Discharge Instructions (Signed)
CALL  IF TEMP>100.4, NOTHING PER VAGINA X 2 WK, CALL IF SOAKING A MAXI  PAD EVERY HOUR OR MORE FREQUENTLYDISCHARGE INSTRUCTIONS: D&C / D&E The following instructions have been prepared to help you care for yourself upon your return home.   Personal hygiene:  Use sanitary pads for vaginal drainage, not tampons.  Shower the day after your procedure.  NO tub baths, pools or Jacuzzis for 2-3 weeks.  Wipe front to back after using the bathroom.  Activity and limitations:  Do NOT drive or operate any equipment for 24 hours. The effects of anesthesia are still present and drowsiness may result.  Do NOT rest in bed all day.  Walking is encouraged.  Walk up and down stairs slowly.  You may resume your normal activity in one to two days or as indicated by your physician.  Sexual activity: NO intercourse for at least 2 weeks after the procedure, or as indicated by your physician.  Diet: Eat a light meal as desired this evening. You may resume your usual diet tomorrow.  Return to work: You may resume your work activities in one to two days or as indicated by your doctor.  What to expect after your surgery: Expect to have vaginal bleeding/discharge for 2-3 days and spotting for up to 10 days. It is not unusual to have soreness for up to 1-2 weeks. You may have a slight burning sensation when you urinate for the first day. Mild cramps may continue for a couple of days. You may have a regular period in 2-6 weeks.  Call your doctor for any of the following:  Excessive vaginal bleeding, saturating and changing one pad every hour.  Inability to urinate 6 hours after discharge from hospital.  Pain not relieved by pain medication.  Fever of 100.4 F or greater.  Unusual vaginal discharge or odor.   Call for an appointment:    Patients signature: ______________________  Nurses signature ________________________  Support person's signature_______________________  Can take  ibuprofen/motrin/advil at [facility] Can use heating pad to abdomen Increase water intake next 48 hours

## 2015-12-21 NOTE — Brief Op Note (Signed)
12/21/2015  12:20 PM  PATIENT:  Jo Goodwin  40 y.o. female  PRE-OPERATIVE DIAGNOSIS:  Possible Molar Pregnancy, Spontaneous Abortion, IDA, Fibroid uterus  POST-OPERATIVE DIAGNOSIS:  same  PROCEDURE:  Suction dilation and curettage  SURGEON:  Surgeon(s) and Role:    * Servando Salina, MD - Primary  PHYSICIAN ASSISTANT:   ASSISTANTS: none   ANESTHESIA:   paracervical block and MAC FINDINGS: multiple uterine fibroids. Small amount of tissue EBL:  Total I/O In: 400 [I.V.:400] Out: 75 [Urine:75]  BLOOD ADMINISTERED:none  DRAINS: none   LOCAL MEDICATIONS USED:  OTHER nesicaine  SPECIMEN:  Source of Specimen:  poc, emc  DISPOSITION OF SPECIMEN:  PATHOLOGY  COUNTS:  YES  TOURNIQUET:  * No tourniquets in log *  DICTATION: .Other Dictation: Dictation Number [id number]  PLAN OF CARE: Discharge to home after PACU  PATIENT DISPOSITION:  PACU - hemodynamically stable.   Delay start of Pharmacological VTE agent (>24hrs) due to surgical blood loss or risk of bleeding: no

## 2015-12-21 NOTE — Op Note (Signed)
NAME:  Jo Goodwin, DARIO NO.:  [id number]  MEDICAL RECORD NO.:  [id number]  LOCATION:  [facility]                          FACILITY:  [facility]  PHYSICIAN:  Servando Salina, M.D.DATE OF BIRTH:  [birth year]  DATE OF PROCEDURE:  12/21/2015 DATE OF DISCHARGE:  12/21/2015                              OPERATIVE REPORT   PREOPERATIVE DIAGNOSES:  Possible molar pregnancy, spontaneous abortion, iron-deficiency anemia.  PROCEDURE:  Suction, dilation and curettage.  POSTOPERATIVE DIAGNOSES:  Possible molar pregnancy, spontaneous abortion, iron-deficiency anemia.  ANESTHESIA:  MAC, paracervical block.  SURGEON:  Servando Salina, M.D.  ASSISTANT:  None.  DESCRIPTION OF PROCEDURE:  Under adequate monitored anesthesia, the patient was placed in the dorsal lithotomy position.  She was sterilely prepped and draped in usual fashion.  Bladder was catheterized for moderate amount of urine.  Examination under anesthesia revealed an irregular fibroid uterus, retroverted, with no adnexal masses that could be appreciated.  A bivalve speculum was placed in the vagina.  A 20 mL of 1% Nesacaine was injected paracervically at 3 and 9 o'clock position. The anterior lip of the cervix was grasped with a single-tooth tenaculum.  The cervix was then serially dilated up to #[facility] dilator.  Medium-sized curette was introduced into the cavity and the cavity was gently curetted followed by a 7-mm suction cannula, which was then utilized to suction the cavity.  Once that was done, the cavity was once again gently curetted for separate specimen.  Once that was done, all instruments were removed from the vagina.  SPECIMEN LABELED:  Products of conception, endometrial curetting, was sent to Pathology.  ESTIMATED BLOOD LOSS:  Minimal.  COMPLICATIONS:  None.  The patient tolerated the procedure well, was transferred to the recovery room in stable condition.    Servando Salina,  M.D.    Early/MEDQ  D:  12/21/2015  T:  12/21/2015  Job:  [id number]

## 2015-12-21 NOTE — H&P (Signed)
Jo Goodwin is an 40 y.o. female.[id number] [name] presents for further surgical evaluation of ? Molar pregnancy. Pt was [redacted] weeks pregnant by date and found on sonogram to have MAB ( 3/23). Pt was scheduled for IPAS but came in the day prior to procedure with vaginal bleeding and subsequently had SAB. Pt brought in the specimen which was sent to path and request for chromosomal studies due to prior SAB. Path showed  Possible molar pregnancy. Pt c/o intermittent heavy vaginal bleeding since loss  Pertinent Gynecological History: Menses: regular every 28 days without intermenstrual spotting Bleeding: nl Contraception: none DES exposure: denies Blood transfusions: none Sexually transmitted diseases: none Previous GYN Procedures: DNC  Last mammogram: n/a Last pap: normal Date: 2016 OB History: G3, P1021   Menstrual History: Menarche age: n/a No LMP recorded. Patient is pregnant.    Past Medical History  Diagnosis Date  . Anemia   . SVD (spontaneous vaginal delivery)     x 1  . GERD (gastroesophageal reflux disease)     diet controlled, no meds  . Frequent headaches     otc med prn    Past surgical: wisdom tooth extraction  Family History  Problem Relation Age of Onset  . Hypertension Father   . Diabetes Sister   . Hypertension Sister   . Diabetes Maternal Grandmother   . Diabetes Maternal Grandfather   . Diabetes Paternal Grandmother   . Hypertension Paternal Grandmother   . Diabetes Paternal Grandfather   . Hypertension Paternal Grandfather     Social History:  reports that she has never smoked. She has never used smokeless tobacco. She reports that she drinks alcohol. She reports that she does not use illicit drugs.  Allergies: No Known Allergies No latex allergy No prescriptions prior to admission    Review of Systems  All other systems reviewed and are negative.   Height 5' 7.75" (1.721 m), weight 109.77 kg (242 lb), not currently breastfeeding. Physical Exam   Constitutional: She is oriented to person, place, and time. She appears well-developed and well-nourished.  HENT:  Head: Atraumatic.  Neck: Neck supple.  Cardiovascular: Regular rhythm.   Respiratory: Breath sounds normal.  GI: Soft.  Genitourinary: Vagina normal.  Neurological: She is alert and oriented to person, place, and time.  Skin: Skin is warm and dry.  Psychiatric: She has a normal mood and affect.  vuvla nl Uterus irreg enlarged (+) fibroids  No results found for this or any previous visit (from the past 24 hour(s)).  No results found.  Assessment/Plan: Molar pregnancy s/p SAB Fibroid uterus P) suction D&E. Hquant. Risk of procedure reviewed including infection, bleeding, uterine perforation and its risk, thermal injury, internal scar tissue  Margy Sumler A 12/21/2015, 7:23 AM

## 2015-12-21 NOTE — MAU Note (Signed)
Had miscarriage,. Passed something, testing showed possible molar preg.  Denies pain or bleeding at this time

## 2015-12-23 ENCOUNTER — Encounter (HOSPITAL_COMMUNITY): Payer: Self-pay | Admitting: Obstetrics and Gynecology

## 2015-12-23 NOTE — Addendum Note (Signed)
Addendum  created 12/23/15 1402 by Laverle Hobby, CRNA   Modules edited: Charges VN

## 2015-12-24 LAB — HEMOGLOBINOPATHY EVALUATION
HGB A2 QUANT: 1.7 % (ref 0.7–3.1)
HGB A: 98.3 % — AB (ref 94.0–98.0)
HGB F QUANT: 0 % (ref 0.0–2.0)
HGB S QUANTITAION: 0 %
Hgb C: 0 %

## 2015-12-28 ENCOUNTER — Other Ambulatory Visit (HOSPITAL_COMMUNITY)
Admission: RE | Admit: 2015-12-28 | Discharge: 2015-12-28 | Disposition: A | Discharge status: Home / Self Care | Payer: 59 | Source: Ambulatory Visit | Attending: Obstetrics and Gynecology | Admitting: Obstetrics and Gynecology

## 2015-12-28 DIAGNOSIS — O021 Missed abortion: Secondary | ICD-10-CM | POA: Insufficient documentation

## 2015-12-28 LAB — HCG, QUANTITATIVE, PREGNANCY: HCG, BETA CHAIN, QUANT, S: 50 m[IU]/mL — AB (ref <5)

## 2016-01-04 ENCOUNTER — Other Ambulatory Visit (HOSPITAL_COMMUNITY)
Admit: 2016-01-04 | Discharge: 2016-01-04 | Disposition: A | Discharge status: Home / Self Care | Payer: 59 | Source: Ambulatory Visit | Attending: Obstetrics and Gynecology | Admitting: Obstetrics and Gynecology

## 2016-01-04 DIAGNOSIS — Z029 Encounter for administrative examinations, unspecified: Secondary | ICD-10-CM | POA: Diagnosis present

## 2016-01-04 LAB — HCG, QUANTITATIVE, PREGNANCY: hCG, Beta Chain, Quant, S: 18 m[IU]/mL — ABNORMAL HIGH (ref <5)

## 2016-01-14 ENCOUNTER — Other Ambulatory Visit (HOSPITAL_COMMUNITY)
Admission: RE | Admit: 2016-01-14 | Discharge: 2016-01-14 | Disposition: A | Discharge status: Home / Self Care | Payer: 59 | Source: Ambulatory Visit | Attending: Obstetrics and Gynecology | Admitting: Obstetrics and Gynecology

## 2016-01-14 DIAGNOSIS — O021 Missed abortion: Secondary | ICD-10-CM | POA: Diagnosis not present

## 2016-01-14 LAB — HCG, QUANTITATIVE, PREGNANCY: hCG, Beta Chain, Quant, S: 6 m[IU]/mL — ABNORMAL HIGH (ref <5)

## 2016-01-18 ENCOUNTER — Other Ambulatory Visit (HOSPITAL_COMMUNITY)
Admission: RE | Admit: 2016-01-18 | Discharge: 2016-01-18 | Disposition: A | Discharge status: Home / Self Care | Payer: 59 | Source: Ambulatory Visit | Attending: Obstetrics and Gynecology | Admitting: Obstetrics and Gynecology

## 2016-01-18 DIAGNOSIS — O021 Missed abortion: Secondary | ICD-10-CM | POA: Insufficient documentation

## 2016-01-18 LAB — HCG, QUANTITATIVE, PREGNANCY: hCG, Beta Chain, Quant, S: 4 m[IU]/mL (ref <5)

## 2016-01-25 ENCOUNTER — Other Ambulatory Visit (HOSPITAL_COMMUNITY)
Admission: RE | Admit: 2016-01-25 | Discharge: 2016-01-25 | Disposition: A | Discharge status: Home / Self Care | Payer: 59 | Source: Ambulatory Visit | Attending: Obstetrics and Gynecology | Admitting: Obstetrics and Gynecology

## 2016-01-25 DIAGNOSIS — O021 Missed abortion: Secondary | ICD-10-CM | POA: Diagnosis not present

## 2016-01-25 LAB — HCG, QUANTITATIVE, PREGNANCY: HCG, BETA CHAIN, QUANT, S: 3 m[IU]/mL (ref <5)

## 2016-02-01 ENCOUNTER — Other Ambulatory Visit (HOSPITAL_COMMUNITY)
Admission: RE | Admit: 2016-02-01 | Discharge: 2016-02-01 | Disposition: A | Discharge status: Home / Self Care | Payer: 59 | Source: Ambulatory Visit | Attending: Obstetrics and Gynecology | Admitting: Obstetrics and Gynecology

## 2016-02-01 DIAGNOSIS — O021 Missed abortion: Secondary | ICD-10-CM | POA: Diagnosis not present

## 2016-02-01 LAB — HCG, QUANTITATIVE, PREGNANCY: hCG, Beta Chain, Quant, S: 2 m[IU]/mL (ref <5)

## 2016-07-29 ENCOUNTER — Other Ambulatory Visit: Payer: Self-pay | Admitting: Radiology

## 2016-09-28 ENCOUNTER — Encounter: Payer: Self-pay | Admitting: Internal Medicine

## 2016-09-28 ENCOUNTER — Ambulatory Visit: Payer: 59 | Admitting: Internal Medicine

## 2016-09-28 ENCOUNTER — Ambulatory Visit (INDEPENDENT_AMBULATORY_CARE_PROVIDER_SITE_OTHER): Payer: 59 | Admitting: Internal Medicine

## 2016-09-28 VITALS — BP 112/70 | HR 71 | Temp 99.2°F | Wt 240.0 lb

## 2016-09-28 DIAGNOSIS — H6983 Other specified disorders of Eustachian tube, bilateral: Secondary | ICD-10-CM

## 2016-09-28 DIAGNOSIS — G44031 Episodic paroxysmal hemicrania, intractable: Secondary | ICD-10-CM

## 2016-09-28 MED ORDER — MOMETASONE FUROATE 50 MCG/ACT NA SUSP: 2.0000 | Freq: Every day | NASAL | 12 refills | Status: DC

## 2016-09-28 NOTE — Progress Notes (Signed)
Subjective:    Patient ID: Jo Goodwin, female    DOB: 05-24-1976, 41 y.o.   MRN: [id number]  HPI  Pt presents to the clinic today with c/o a headache. This started 4-5 days ago. It is located on the left side of her head. She describes the pain as pounding. She has had associated ringing in the left ear, but she denies loss of hearing. She denies sensitivity to light, sound, nausea or vomiting. She denies sinus symptoms, trauma to the head or left ear. She has tried Tylenol with minimal relief.    Review of Systems      Past Medical History:  Diagnosis Date  . Anemia   . Frequent headaches    otc med prn  . GERD (gastroesophageal reflux disease)    diet controlled, no meds  . SVD (spontaneous vaginal delivery)    x 1    Current Outpatient Prescriptions  Medication Sig Dispense Refill  . acetaminophen (TYLENOL) 500 MG tablet Take 1,000 mg by mouth every 6 (six) hours as needed for headache.    . ferrous sulfate 325 (65 FE) MG tablet Take 1 tablet (325 mg total) by mouth 3 (three) times daily with meals. 30 tablet 3  . ibuprofen (ADVIL,MOTRIN) 800 MG tablet Take 1 tablet (800 mg total) by mouth every 8 (eight) hours as needed. 30 tablet 4  . norethindrone-ethinyl estradiol (JUNEL 1/20) 1-20 MG-MCG tablet Take 1 tablet by mouth daily.     No current facility-administered medications for this visit.     No Known Allergies  Family History  Problem Relation Age of Onset  . Hypertension Father   . Diabetes Sister   . Hypertension Sister   . Diabetes Maternal Grandmother   . Diabetes Maternal Grandfather   . Diabetes Paternal Grandmother   . Hypertension Paternal Grandmother   . Diabetes Paternal Grandfather   . Hypertension Paternal Grandfather     Social History   Social History  . Marital status: Single    Spouse name: N/A  . Number of children: N/A  . Years of education: N/A   Occupational History  . Not on file.   Social History Main Topics  . Smoking  status: Never Smoker  . Smokeless tobacco: Never Used  . Alcohol use Yes     Comment: occasional wine  . Drug use: No  . Sexual activity: Yes    Birth control/ protection: None     Comment: approx [redacted] wks gestation   Other Topics Concern  . Not on file   Social History Narrative  . No narrative on file     Constitutional: Pt reports headache. Denies fever, malaise, fatigue, or abrupt weight changes.  HEENT: Pt reports ringing in her left ear.Denies eye pain, eye redness, ear pain, wax buildup, runny nose, nasal congestion, bloody nose, or sore throat. Respiratory: Denies difficulty breathing, shortness of breath, cough or sputum production.   Neurological: Denies dizziness, difficulty with memory, difficulty with speech or problems with balance and coordination.    No other specific complaints in a complete review of systems (except as listed in HPI above).  Objective:   Physical Exam   BP 112/70   Pulse 71   Temp 99.2 F (37.3 C) (Oral)   Wt 240 lb (108.9 kg)   SpO2 99%   Breastfeeding? Unknown   BMI 37.03 kg/m  Wt Readings from Last 3 Encounters:  09/28/16 240 lb (108.9 kg)  12/21/15 243 lb 6.4 oz (110.4 kg)  01/24/15 239 lb (108.4 kg)    General: Appears her stated age, in NAD. HEENT: Head: normal shape and size; Eyes: sclera white, no icterus, conjunctiva pink, PERRLA and EOMs intact; Ears: Tm's gray and intact, normal light reflex, + serous effusion bilaterally. Musculoskeletal: Normal flexion, extension, rotation and lateral bending of the cervical spine. No bony tenderness noted over the spine.  Neurological: Alert and oriented. Coordination normal.    BMET    Component Value Date/Time   NA 137 04/02/2014 1552   K 4.1 04/02/2014 1552   CL 104 04/02/2014 1552   CO2 25 04/02/2014 1552   GLUCOSE 90 04/02/2014 1552   BUN 10 04/02/2014 1552   CREATININE 0.9 04/02/2014 1552   CALCIUM 8.8 04/02/2014 1552    Lipid Panel     Component Value Date/Time    CHOL 130 04/02/2014 1552   TRIG 78.0 04/02/2014 1552   HDL 37.10 (L) 04/02/2014 1552   CHOLHDL 4 04/02/2014 1552   VLDL 15.6 04/02/2014 1552   LDLCALC 77 04/02/2014 1552    CBC    Component Value Date/Time   WBC 5.1 12/21/2015 0955   RBC 4.50 12/21/2015 0955   HGB 8.9 (L) 12/21/2015 0955   HCT 30.0 (L) 12/21/2015 0955   PLT 330 12/21/2015 0955   MCV 66.7 (L) 12/21/2015 0955   MCH 19.8 (L) 12/21/2015 0955   MCHC 29.7 (L) 12/21/2015 0955   RDW 19.4 (H) 12/21/2015 0955   LYMPHSABS 1.7 01/24/2015 1104   MONOABS 0.5 01/24/2015 1104   EOSABS 0.2 01/24/2015 1104   BASOSABS 0.0 01/24/2015 1104    Hgb A1C Lab Results  Component Value Date   HGBA1C 5.5 01/24/2015       Assessment & Plan:   Headache:  ? ETD bilateral Advised her to take 1000 mg of Tylenol and 800 mg Ibuprofen x 1 dose eRx for Nasonex 1 spray each nostril daily x 2 weeks If tinnitus persist, can refer to ENT/Audiology If headache persist, can try Fioricet  Return precautions discussed Webb Silversmith, NP

## 2016-09-28 NOTE — Patient Instructions (Signed)
General Headache Without Cause Introduction A headache is pain or discomfort felt around the head or neck area. There are many causes and types of headaches. In some cases, the cause may not be found. Follow these instructions at home: Managing pain  Take over-the-counter and prescription medicines only as told by your doctor.  Lie down in a dark, quiet room when you have a headache.  If directed, apply ice to the head and neck area:  Put ice in a plastic bag.  Place a towel between your skin and the bag.  Leave the ice on for 20 minutes, 2-3 times per day.  Use a heating pad or hot shower to apply heat to the head and neck area as told by your doctor.  Keep lights dim if bright lights bother you or make your headaches worse. Eating and drinking  Eat meals on a regular schedule.  Lessen how much alcohol you drink.  Lessen how much caffeine you drink, or stop drinking caffeine. General instructions  Keep all follow-up visits as told by your doctor. This is important.  Keep a journal to find out if certain things bring on headaches. For example, write down:  What you eat and drink.  How much sleep you get.  Any change to your diet or medicines.  Relax by getting a massage or doing other relaxing activities.  Lessen stress.  Sit up straight. Do not tighten (tense) your muscles.  Do not use tobacco products. This includes cigarettes, chewing tobacco, or e-cigarettes. If you need help quitting, ask your doctor.  Exercise regularly as told by your doctor.  Get enough sleep. This often means 7-9 hours of sleep. Contact a doctor if:  Your symptoms are not helped by medicine.  You have a headache that feels different than the other headaches.  You feel sick to your stomach (nauseous) or you throw up (vomit).  You have a fever. Get help right away if:  Your headache becomes really bad.  You keep throwing up.  You have a stiff neck.  You have trouble  seeing.  You have trouble speaking.  You have pain in the eye or ear.  Your muscles are weak or you lose muscle control.  You lose your balance or have trouble walking.  You feel like you will pass out (faint) or you pass out.  You have confusion. This information is not intended to replace advice given to you by your health care provider. Make sure you discuss any questions you have with your health care provider. Document Released: 06/09/2008 Document Revised: 02/06/2016 Document Reviewed: 12/24/2014  2017 Elsevier  

## 2016-10-02 ENCOUNTER — Telehealth: Payer: Self-pay

## 2016-10-02 NOTE — Telephone Encounter (Signed)
Pt said nasonex is not on ins formulary; advised pt to contact ins co and see what med can be substituted and pt will cb with information.

## 2016-10-28 ENCOUNTER — Other Ambulatory Visit: Payer: Self-pay

## 2016-10-28 MED ORDER — FLUTICASONE PROPIONATE 50 MCG/ACT NA SUSP: 2.0000 | Freq: Every day | NASAL | 6 refills | Status: DC

## 2017-05-06 ENCOUNTER — Encounter: Payer: BLUE CROSS/BLUE SHIELD | Admitting: Internal Medicine

## 2017-05-27 ENCOUNTER — Encounter: Payer: Self-pay | Admitting: Internal Medicine

## 2017-05-27 ENCOUNTER — Ambulatory Visit (INDEPENDENT_AMBULATORY_CARE_PROVIDER_SITE_OTHER): Payer: Managed Care, Other (non HMO) | Admitting: Internal Medicine

## 2017-05-27 VITALS — BP 122/80 | HR 73 | Temp 98.1°F | Ht 68.0 in | Wt 241.0 lb

## 2017-05-27 DIAGNOSIS — E010 Iodine-deficiency related diffuse (endemic) goiter: Secondary | ICD-10-CM

## 2017-05-27 DIAGNOSIS — Z0001 Encounter for general adult medical examination with abnormal findings: Secondary | ICD-10-CM | POA: Diagnosis not present

## 2017-05-27 LAB — TSH: TSH: 0.58 u[IU]/mL (ref 0.35–4.50)

## 2017-05-27 LAB — CBC
HEMATOCRIT: 44.7 % (ref 36.0–46.0)
Hemoglobin: 14.7 g/dL (ref 12.0–15.0)
MCHC: 32.8 g/dL (ref 30.0–36.0)
MCV: 87.3 fl (ref 78.0–100.0)
PLATELETS: 278 10*3/uL (ref 150.0–400.0)
RBC: 5.12 Mil/uL — AB (ref 3.87–5.11)
RDW: 12.9 % (ref 11.5–15.5)
WBC: 8 10*3/uL (ref 4.0–10.5)

## 2017-05-27 LAB — COMPREHENSIVE METABOLIC PANEL
ALBUMIN: 3.8 g/dL (ref 3.5–5.2)
ALT: 12 U/L (ref 0–35)
AST: 18 U/L (ref 0–37)
Alkaline Phosphatase: 55 U/L (ref 39–117)
BUN: 11 mg/dL (ref 6–23)
CALCIUM: 9.1 mg/dL (ref 8.4–10.5)
CHLORIDE: 105 meq/L (ref 96–112)
CO2: 26 meq/L (ref 19–32)
Creatinine, Ser: 0.91 mg/dL (ref 0.40–1.20)
GFR: 87.45 mL/min (ref >60.00)
Glucose, Bld: 86 mg/dL (ref 70–99)
POTASSIUM: 4.2 meq/L (ref 3.5–5.1)
Sodium: 138 mEq/L (ref 135–145)
Total Bilirubin: 0.3 mg/dL (ref 0.2–1.2)
Total Protein: 7 g/dL (ref 6.0–8.3)

## 2017-05-27 LAB — LIPID PANEL
CHOL/HDL RATIO: 4
CHOLESTEROL: 138 mg/dL (ref 0–200)
HDL: 39.4 mg/dL (ref >39.00)
LDL CALC: 84 mg/dL (ref 0–99)
NonHDL: 98.8
TRIGLYCERIDES: 75 mg/dL (ref 0.0–149.0)
VLDL: 15 mg/dL (ref 0.0–40.0)

## 2017-05-27 LAB — IBC PANEL
Iron: 63 ug/dL (ref 42–145)
SATURATION RATIOS: 18.4 % — AB (ref 20.0–50.0)
Transferrin: 245 mg/dL (ref 212.0–360.0)

## 2017-05-27 LAB — HEMOGLOBIN A1C: Hgb A1c MFr Bld: 5.2 % (ref 4.6–6.5)

## 2017-05-27 NOTE — Patient Instructions (Signed)

## 2017-05-27 NOTE — Progress Notes (Signed)
Subjective:    Patient ID: Jo Goodwin, female    DOB: 12-02-1975, 41 y.o.   MRN: [id number]  HPI  Pt presents to the clinic today for her annual exam.  Flu: never Tetanus: unsure Pap Smear: 2017, [facility] Mammogram: 2017, [facility] Vision Screening: annually Dentist: annually  Diet: She does eat meat. She consumes fruits and veggies daily. She occassionally eats fried foods. She drinks mostly Exercise: None  Review of Systems      Past Medical History:  Diagnosis Date  . Anemia   . Frequent headaches    otc med prn  . GERD (gastroesophageal reflux disease)    diet controlled, no meds  . SVD (spontaneous vaginal delivery)    x 1    Current Outpatient Prescriptions  Medication Sig Dispense Refill  . acetaminophen (TYLENOL) 500 MG tablet Take 1,000 mg by mouth every 6 (six) hours as needed for headache.    . ferrous sulfate 325 (65 FE) MG tablet Take 1 tablet (325 mg total) by mouth 3 (three) times daily with meals. 30 tablet 3  . fluticasone (FLONASE) 50 MCG/ACT nasal spray Place 2 sprays into both nostrils daily. 16 g 6  . ibuprofen (ADVIL,MOTRIN) 800 MG tablet Take 1 tablet (800 mg total) by mouth every 8 (eight) hours as needed. 30 tablet 4  . norethindrone-ethinyl estradiol (JUNEL 1/20) 1-20 MG-MCG tablet Take 1 tablet by mouth daily.     No current facility-administered medications for this visit.     No Known Allergies  Family History  Problem Relation Age of Onset  . Hypertension Father   . Diabetes Sister   . Hypertension Sister   . Diabetes Maternal Grandmother   . Diabetes Maternal Grandfather   . Diabetes Paternal Grandmother   . Hypertension Paternal Grandmother   . Diabetes Paternal Grandfather   . Hypertension Paternal Grandfather     Social History   Social History  . Marital status: Single    Spouse name: N/A  . Number of children: N/A  . Years of education: N/A   Occupational History  . Not on file.   Social History  Main Topics  . Smoking status: Never Smoker  . Smokeless tobacco: Never Used  . Alcohol use Yes     Comment: occasional wine  . Drug use: No  . Sexual activity: Yes    Birth control/ protection: None     Comment: approx [redacted] wks gestation   Other Topics Concern  . Not on file   Social History Narrative  . No narrative on file     Constitutional: Denies fever, malaise, fatigue, headache or abrupt weight changes.  HEENT: Denies eye pain, eye redness, ear pain, ringing in the ears, wax buildup, runny nose, nasal congestion, bloody nose, or sore throat. Respiratory: Denies difficulty breathing, shortness of breath, cough or sputum production.   Cardiovascular: Denies chest pain, chest tightness, palpitations or swelling in the hands or feet.  Gastrointestinal: Pt reports intermittent reflux and constipation. Denies abdominal pain, bloating, diarrhea or blood in the stool.  GU: Denies urgency, frequency, pain with urination, burning sensation, blood in urine, odor or discharge. Musculoskeletal: Pt reports intermittent hip pain. Denies decrease in range of motion, difficulty with gait, muscle pain or joint swelling.  Skin: Denies redness, rashes, lesions or ulcercations.  Neurological: Denies dizziness, difficulty with memory, difficulty with speech or problems with balance and coordination.  Psych: Denies anxiety, depression, SI/HI.  No other specific complaints in a complete review  of systems (except as listed in HPI above).   Objective:   Physical Exam   BP 122/80   Pulse 73   Temp 98.1 F (36.7 C) (Oral)   Ht 5\' 8"  (1.727 m)   Wt 241 lb (109.3 kg)   SpO2 100%   BMI 36.64 kg/m  Wt Readings from Last 3 Encounters:  05/27/17 241 lb (109.3 kg)  09/28/16 240 lb (108.9 kg)  12/21/15 243 lb 6.4 oz (110.4 kg)    General: Appears his stated age, obese in NAD. Skin: Warm, dry and intact. No rashes, lesions or ulcerations noted. HEENT: Head: normal shape and size; Eyes: sclera  white, no icterus, conjunctiva pink, PERRLA and EOMs intact; Ears: Tm's gray and intact, normal light reflex; Nose: mucosa pink and moist, septum midline; Throat/Mouth: Teeth present, mucosa pink and moist, no exudate, lesions or ulcerations noted.  Neck:  Neck supple, trachea midline. No masses, lumps present. Thyromegaly noted. Cardiovascular: Normal rate and rhythm. S1,S2 noted.  No murmur, rubs or gallops noted. No JVD or BLE edema. Pulmonary/Chest: Normal effort and positive vesicular breath sounds. No respiratory distress. No wheezes, rales or ronchi noted.  Abdomen: Soft and nontender. Normal bowel sounds. No distention or masses noted. Liver, spleen and kidneys non palpable. Musculoskeletal: Strength 5/5 BUE/BLE. No difficulty with gait.  Neurological: Alert and oriented. Cranial nerves II-XII grossly intact. Coordination normal.  Psychiatric: Mood and affect normal. Behavior is normal. Judgment and thought content normal.    BMET    Component Value Date/Time   NA 137 04/02/2014 1552   K 4.1 04/02/2014 1552   CL 104 04/02/2014 1552   CO2 25 04/02/2014 1552   GLUCOSE 90 04/02/2014 1552   BUN 10 04/02/2014 1552   CREATININE 0.9 04/02/2014 1552   CALCIUM 8.8 04/02/2014 1552    Lipid Panel     Component Value Date/Time   CHOL 130 04/02/2014 1552   TRIG 78.0 04/02/2014 1552   HDL 37.10 (L) 04/02/2014 1552   CHOLHDL 4 04/02/2014 1552   VLDL 15.6 04/02/2014 1552   LDLCALC 77 04/02/2014 1552    CBC    Component Value Date/Time   WBC 5.1 12/21/2015 0955   RBC 4.50 12/21/2015 0955   HGB 8.9 (L) 12/21/2015 0955   HCT 30.0 (L) 12/21/2015 0955   PLT 330 12/21/2015 0955   MCV 66.7 (L) 12/21/2015 0955   MCH 19.8 (L) 12/21/2015 0955   MCHC 29.7 (L) 12/21/2015 0955   RDW 19.4 (H) 12/21/2015 0955   LYMPHSABS 1.7 01/24/2015 1104   MONOABS 0.5 01/24/2015 1104   EOSABS 0.2 01/24/2015 1104   BASOSABS 0.0 01/24/2015 1104    Hgb A1C Lab Results  Component Value Date   HGBA1C  5.5 01/24/2015           Assessment & Plan:   Preventative Health  Maintenance:  Encouraged her to get a flu shot in the fall She declines tetanus booster today Pap smear UTD Mammogram UTD Encouraged her to consume a balanced diet and exercise regimen Advised her to see an eye doctor and dentist annually Will check CBC, CMET, Lipid, TSH, A1C today  RTC in 1 year, sooner if needed Webb Silversmith, NP

## 2018-06-29 ENCOUNTER — Telehealth: Payer: Self-pay | Admitting: Internal Medicine

## 2018-06-29 NOTE — Telephone Encounter (Signed)
See below crm   Copied from [id number]. Topic: Appointment Scheduling - Scheduling Inquiry for Clinic >> Jun 29, 2018  9:05 AM Scherrie Gerlach wrote: Reason for CRM: pt needs biometric screening for her job done by end of next week.  Wants to know if Rollene Fare will work her in? She forgot it was due. Last 05/27/2017 and states it is a cpe. Please advise

## 2018-06-29 NOTE — Telephone Encounter (Signed)
Jo Goodwin  Can you help with this  thanks

## 2018-06-29 NOTE — Telephone Encounter (Signed)
4 o'clock this Friday the 18th

## 2018-06-30 NOTE — Telephone Encounter (Signed)
lvm asking pt to call back to schedule °

## 2018-07-01 ENCOUNTER — Encounter: Payer: Self-pay | Admitting: Internal Medicine

## 2018-07-01 ENCOUNTER — Ambulatory Visit (INDEPENDENT_AMBULATORY_CARE_PROVIDER_SITE_OTHER)
Admission: RE | Admit: 2018-07-01 | Discharge: 2018-07-01 | Disposition: A | Discharge status: Home / Self Care | Payer: Managed Care, Other (non HMO) | Source: Ambulatory Visit | Attending: Internal Medicine | Admitting: Internal Medicine

## 2018-07-01 ENCOUNTER — Ambulatory Visit (INDEPENDENT_AMBULATORY_CARE_PROVIDER_SITE_OTHER): Payer: Managed Care, Other (non HMO) | Admitting: Internal Medicine

## 2018-07-01 VITALS — BP 124/90 | HR 74 | Temp 98.0°F | Ht 67.5 in | Wt 242.0 lb

## 2018-07-01 DIAGNOSIS — Z0000 Encounter for general adult medical examination without abnormal findings: Secondary | ICD-10-CM | POA: Diagnosis not present

## 2018-07-01 DIAGNOSIS — Z23 Encounter for immunization: Secondary | ICD-10-CM | POA: Diagnosis not present

## 2018-07-01 DIAGNOSIS — M25551 Pain in right hip: Secondary | ICD-10-CM | POA: Diagnosis not present

## 2018-07-01 DIAGNOSIS — M25552 Pain in left hip: Secondary | ICD-10-CM

## 2018-07-01 NOTE — Patient Instructions (Signed)

## 2018-07-01 NOTE — Progress Notes (Signed)
Subjective:    Patient ID: Jo Goodwin, female    DOB: November 20, 1975, 42 y.o.   MRN: [id number]  HPI  Pt presents to the clinic today for her annual exam.  Flu: never Tetanus: unsure Pap Smear: 2018, [facility] Mammogram: 2017, [facility] Vision Screening: annually Dentist: annually  Diet: She does eat meat. She consumes fruits and veggies daily. She does eat fried foods. She is drinking mostly water, some tea. Exercise: None  Review of Systems      Past Medical History:  Diagnosis Date  . Anemia   . Frequent headaches    otc med prn  . GERD (gastroesophageal reflux disease)    diet controlled, no meds  . SVD (spontaneous vaginal delivery)    x 1    Current Outpatient Medications  Medication Sig Dispense Refill  . acetaminophen (TYLENOL) 500 MG tablet Take 1,000 mg by mouth every 6 (six) hours as needed for headache.    . Cholecalciferol (VITAMIN D) 2000 units CAPS Take 1 capsule by mouth daily.    . ferrous sulfate 325 (65 FE) MG tablet Take 1 tablet (325 mg total) by mouth 3 (three) times daily with meals. 30 tablet 3  . norethindrone-ethinyl estradiol (JUNEL 1/20) 1-20 MG-MCG tablet Take 1 tablet by mouth daily.     No current facility-administered medications for this visit.     No Known Allergies  Family History  Problem Relation Age of Onset  . Hypertension Father   . Diabetes Sister   . Hypertension Sister   . Diabetes Maternal Grandmother   . Diabetes Maternal Grandfather   . Diabetes Paternal Grandmother   . Hypertension Paternal Grandmother   . Diabetes Paternal Grandfather   . Hypertension Paternal Grandfather     Social History   Socioeconomic History  . Marital status: Single    Spouse name: Not on file  . Number of children: Not on file  . Years of education: Not on file  . Highest education level: Not on file  Occupational History  . Not on file  Social Needs  . Financial resource strain: Not on file  . Food insecurity:   Worry: Not on file    Inability: Not on file  . Transportation needs:    Medical: Not on file    Non-medical: Not on file  Tobacco Use  . Smoking status: Never Smoker  . Smokeless tobacco: Never Used  Substance and Sexual Activity  . Alcohol use: Yes    Comment: rare   . Drug use: No  . Sexual activity: Yes    Birth control/protection: None    Comment: approx [redacted] wks gestation  Lifestyle  . Physical activity:    Days per week: Not on file    Minutes per session: Not on file  . Stress: Not on file  Relationships  . Social connections:    Talks on phone: Not on file    Gets together: Not on file    Attends religious service: Not on file    Active member of club or organization: Not on file    Attends meetings of clubs or organizations: Not on file    Relationship status: Not on file  . Intimate partner violence:    Fear of current or ex partner: Not on file    Emotionally abused: Not on file    Physically abused: Not on file    Forced sexual activity: Not on file  Other Topics Concern  . Not on  file  Social History Narrative  . Not on file     Constitutional: Denies fever, malaise, fatigue, headache or abrupt weight changes.  HEENT: Denies eye pain, eye redness, ear pain, ringing in the ears, wax buildup, runny nose, nasal congestion, bloody nose, or sore throat. Respiratory: Denies difficulty breathing, shortness of breath, cough or sputum production.   Cardiovascular: Denies chest pain, chest tightness, palpitations or swelling in the hands or feet.  Gastrointestinal: Denies abdominal pain, bloating, constipation, diarrhea or blood in the stool.  GU: Denies urgency, frequency, pain with urination, burning sensation, blood in urine, odor or discharge. Musculoskeletal: Pt reports bilateral hip pain. Denies decrease in range of motion, difficulty with gait, muscle pain or joint swelling.  Skin: Denies redness, rashes, lesions or ulcercations.  Neurological: Denies  dizziness, difficulty with memory, difficulty with speech or problems with balance and coordination.  Psych: Denies anxiety, depression, SI/HI.  No other specific complaints in a complete review of systems (except as listed in HPI above).  Objective:   Physical Exam  BP 124/90   Pulse 74   Temp 98 F (36.7 C) (Oral)   Ht 5' 7.5" (1.715 m)   Wt 242 lb (109.8 kg)   SpO2 98%   BMI 37.34 kg/m  Wt Readings from Last 3 Encounters:  07/01/18 242 lb (109.8 kg)  05/27/17 241 lb (109.3 kg)  09/28/16 240 lb (108.9 kg)    General: Appears her stated age, obese, in NAD. Skin: Warm, dry and intact. No rashes, lesions or ulcerations noted. HEENT: Head: normal shape and size; Eyes: sclera white, no icterus, conjunctiva pink, PERRLA and EOMs intact; Ears: Tm's gray and intact, normal light reflex; Throat/Mouth: Teeth present, mucosa pink and moist, no exudate, lesions or ulcerations noted.  Neck:  Neck supple, trachea midline. No masses, lumps or thyromegaly present.  Cardiovascular: Normal rate and rhythm. S1,S2 noted.  No murmur, rubs or gallops noted. No JVD or BLE edema.  Pulmonary/Chest: Normal effort and positive vesicular breath sounds. No respiratory distress. No wheezes, rales or ronchi noted.  Abdomen: Soft and nontender. Normal bowel sounds. No distention or masses noted. Liver, spleen and kidneys non palpable. Musculoskeletal: Normal abduction, adductions, internal and external rotation of the hips. No pain with palpation. Strength 5/5 BUE/BLE. No difficulty with gait.  Neurological: Alert and oriented. Cranial nerves II-XII grossly intact. Coordination normal.  Psychiatric: Mood and affect normal. Behavior is normal. Judgment and thought content normal.    BMET    Component Value Date/Time   NA 138 05/27/2017 1455   K 4.2 05/27/2017 1455   CL 105 05/27/2017 1455   CO2 26 05/27/2017 1455   GLUCOSE 86 05/27/2017 1455   BUN 11 05/27/2017 1455   CREATININE 0.91 05/27/2017 1455    CALCIUM 9.1 05/27/2017 1455    Lipid Panel     Component Value Date/Time   CHOL 138 05/27/2017 1455   TRIG 75.0 05/27/2017 1455   HDL 39.40 05/27/2017 1455   CHOLHDL 4 05/27/2017 1455   VLDL 15.0 05/27/2017 1455   LDLCALC 84 05/27/2017 1455    CBC    Component Value Date/Time   WBC 8.0 05/27/2017 1455   RBC 5.12 (H) 05/27/2017 1455   HGB 14.7 05/27/2017 1455   HCT 44.7 05/27/2017 1455   PLT 278.0 05/27/2017 1455   MCV 87.3 05/27/2017 1455   MCH 19.8 (L) 12/21/2015 0955   MCHC 32.8 05/27/2017 1455   RDW 12.9 05/27/2017 1455   LYMPHSABS 1.7 01/24/2015 1104  MONOABS 0.5 01/24/2015 1104   EOSABS 0.2 01/24/2015 1104   BASOSABS 0.0 01/24/2015 1104    Hgb A1C Lab Results  Component Value Date   HGBA1C 5.2 05/27/2017            Assessment & Plan:   Preventative Health Maintenance:  She declines flu shot today Tetanus booster today Pap smear UTD She will call to schedule her mammogram Encouraged her to consume a balanced diet and exercise regimen Advised her to see an eye doctor and dentist annually Will check CBC, CMET, Lipid and Vit D today  Bilateral Hip Pain:  Xray bilateral hips today  Will follow up after xray, return precautions discussed Webb Silversmith, NP

## 2018-07-02 LAB — HEMOGLOBIN A1C
EAG (MMOL/L): 5.8 (calc)
HEMOGLOBIN A1C: 5.3 %{Hb} (ref <5.7)
MEAN PLASMA GLUCOSE: 105 (calc)

## 2018-07-02 LAB — COMPREHENSIVE METABOLIC PANEL
AG Ratio: 1.2 (calc) (ref 1.0–2.5)
ALBUMIN MSPROF: 3.8 g/dL (ref 3.6–5.1)
ALT: 10 U/L (ref 6–29)
AST: 14 U/L (ref 10–30)
Alkaline phosphatase (APISO): 63 U/L (ref 33–115)
BUN: 13 mg/dL (ref 7–25)
CHLORIDE: 107 mmol/L (ref 98–110)
CO2: 24 mmol/L (ref 20–32)
CREATININE: 1.02 mg/dL (ref 0.50–1.10)
Calcium: 9.3 mg/dL (ref 8.6–10.2)
GLOBULIN: 3.2 g/dL (ref 1.9–3.7)
GLUCOSE: 85 mg/dL (ref 65–99)
Potassium: 4.8 mmol/L (ref 3.5–5.3)
SODIUM: 139 mmol/L (ref 135–146)
TOTAL PROTEIN: 7 g/dL (ref 6.1–8.1)
Total Bilirubin: 0.2 mg/dL (ref 0.2–1.2)

## 2018-07-02 LAB — CBC
HEMATOCRIT: 42.9 % (ref 35.0–45.0)
Hemoglobin: 14.6 g/dL (ref 11.7–15.5)
MCH: 28.2 pg (ref 27.0–33.0)
MCHC: 34 g/dL (ref 32.0–36.0)
MCV: 83 fL (ref 80.0–100.0)
MPV: 10.1 fL (ref 7.5–12.5)
Platelets: 285 10*3/uL (ref 140–400)
RBC: 5.17 10*6/uL — ABNORMAL HIGH (ref 3.80–5.10)
RDW: 12.5 % (ref 11.0–15.0)
WBC: 8 10*3/uL (ref 3.8–10.8)

## 2018-07-02 LAB — VITAMIN D 25 HYDROXY (VIT D DEFICIENCY, FRACTURES): Vit D, 25-Hydroxy: 21 ng/mL — ABNORMAL LOW (ref 30–100)

## 2018-07-02 LAB — LIPID PANEL
CHOL/HDL RATIO: 4.6 (calc) (ref <5.0)
Cholesterol: 155 mg/dL (ref <200)
HDL: 34 mg/dL — AB (ref >50)
LDL Cholesterol (Calc): 102 mg/dL (calc) — ABNORMAL HIGH
Non-HDL Cholesterol (Calc): 121 mg/dL (calc) (ref <130)
Triglycerides: 92 mg/dL (ref <150)

## 2018-07-07 ENCOUNTER — Other Ambulatory Visit: Payer: Self-pay | Admitting: Internal Medicine

## 2018-07-07 DIAGNOSIS — E559 Vitamin D deficiency, unspecified: Secondary | ICD-10-CM

## 2018-07-07 MED ORDER — VITAMIN D (ERGOCALCIFEROL) 1.25 MG (50000 UNIT) PO CAPS: 50000.0000 [IU] | ORAL_CAPSULE | ORAL | 0 refills | Status: DC

## 2018-07-08 ENCOUNTER — Encounter: Payer: Self-pay | Admitting: Internal Medicine

## 2018-07-13 NOTE — Addendum Note (Signed)
Addended by: Lurlean Nanny on: 07/13/2018 01:56 PM   Modules accepted: Orders

## 2018-09-21 ENCOUNTER — Encounter: Payer: Self-pay | Admitting: Family Medicine

## 2018-09-21 ENCOUNTER — Ambulatory Visit: Payer: Managed Care, Other (non HMO) | Admitting: Family Medicine

## 2018-09-21 VITALS — BP 132/86 | HR 72 | Temp 98.2°F | Ht 67.5 in | Wt 244.8 lb

## 2018-09-21 DIAGNOSIS — J069 Acute upper respiratory infection, unspecified: Secondary | ICD-10-CM | POA: Insufficient documentation

## 2018-09-21 MED ORDER — GUAIFENESIN-CODEINE 100-10 MG/5ML PO SYRP: 5.0000 mL | ORAL_SOLUTION | Freq: Two times a day (BID) | ORAL | 0 refills | Status: DC | PRN

## 2018-09-21 NOTE — Patient Instructions (Signed)
You have a viral upper respiratory infection. Antibiotics are not needed for this.  Viral infections usually take 7-10 days to resolve.  The cough can last a few weeks to go away. Use medication as prescribed: codeine cough syrup.  May continue mucinex with large glass of water to mobilize mucous.  Take ibuprofen 400-600mg  with meals for next 3-5 days for inflammation Push fluids and plenty of rest. Let us know if fever >101, worsening productive cough or not improving with treatment.  Call clinic with questions.  Good to see you today. I hope you start feeling better soon.

## 2018-09-21 NOTE — Progress Notes (Signed)
BP 132/86 (BP Location: Left Arm, Patient Position: Sitting, Cuff Size: Large)   Pulse 72   Temp 98.2 F (36.8 C) (Oral)   Ht 5' 7.5" (1.715 m)   Wt 244 lb 12 oz (111 kg)   LMP  (Within Years)   SpO2 98%   BMI 37.77 kg/m    CC: cough Subjective:    Patient ID: Jo Goodwin, female    DOB: Mar 24, 1976, 43 y.o.   MRN: [id number]  HPI: Jo Goodwin is a 43 y.o. female presenting on 09/21/2018 for Cough (C/o cough and chest congestion. Started 09/15/18. Tried OTC cough meds. Says sxs are improving.)   1 wk h/o productive cough and chest > head congestion. Initial chills. Cough worse at night time. Some PNDrainage. Cough keeping her up at night.   No fevers, ear or tooth pain, ST or dyspnea or wheezing.    Has tried OTC robitussin DM, mucinex DM, dayquil and theraflu.  No h/o asthma.  Husband sick at home.  Non smoker.      Relevant past medical, surgical, family and social history reviewed and updated as indicated. Interim medical history since our last visit reviewed. Allergies and medications reviewed and updated. Outpatient Medications Prior to Visit  Medication Sig Dispense Refill  . acetaminophen (TYLENOL) 500 MG tablet Take 1,000 mg by mouth every 6 (six) hours as needed for headache.    . Cholecalciferol (VITAMIN D) 2000 units CAPS Take 1 capsule by mouth daily.    . norethindrone-ethinyl estradiol (JUNEL 1/20) 1-20 MG-MCG tablet Take 1 tablet by mouth daily.    . Vitamin D, Ergocalciferol, (DRISDOL) 50000 units CAPS capsule Take 1 capsule (50,000 Units total) by mouth every 7 (seven) days. 12 capsule 0   No facility-administered medications prior to visit.      Per HPI unless specifically indicated in ROS section below Review of Systems Objective:    BP 132/86 (BP Location: Left Arm, Patient Position: Sitting, Cuff Size: Large)   Pulse 72   Temp 98.2 F (36.8 C) (Oral)   Ht 5' 7.5" (1.715 m)   Wt 244 lb 12 oz (111 kg)   LMP  (Within Years)   SpO2 98%    BMI 37.77 kg/m   Wt Readings from Last 3 Encounters:  09/21/18 244 lb 12 oz (111 kg)  07/01/18 242 lb (109.8 kg)  05/27/17 241 lb (109.3 kg)    Physical Exam Vitals signs and nursing note reviewed.  Constitutional:      General: She is not in acute distress.    Appearance: Normal appearance. She is well-developed.  HENT:     Head: Normocephalic and atraumatic.     Right Ear: Hearing, tympanic membrane, ear canal and external ear normal.     Left Ear: Hearing, tympanic membrane, ear canal and external ear normal.     Nose: Mucosal edema (with erythematous mucosa) present. No rhinorrhea.     Right Sinus: No maxillary sinus tenderness or frontal sinus tenderness.     Left Sinus: No maxillary sinus tenderness or frontal sinus tenderness.     Mouth/Throat:     Pharynx: Uvula midline. No oropharyngeal exudate or posterior oropharyngeal erythema.     Tonsils: No tonsillar abscesses.  Eyes:     General: No scleral icterus.    Conjunctiva/sclera: Conjunctivae normal.     Pupils: Pupils are equal, round, and reactive to light.  Neck:     Musculoskeletal: Normal range of motion and neck supple.  Cardiovascular:  Rate and Rhythm: Normal rate and regular rhythm.     Heart sounds: Normal heart sounds. No murmur.  Pulmonary:     Effort: Pulmonary effort is normal. No respiratory distress.     Breath sounds: Normal breath sounds. No wheezing or rales.     Comments: Lungs clear Coughing fits present Lymphadenopathy:     Cervical: No cervical adenopathy.  Skin:    General: Skin is warm and dry.     Findings: No rash.  Neurological:     Mental Status: She is alert.       Assessment & Plan:   Problem List Items Addressed This Visit    URI with cough and congestion - Primary    Anticipate viral. Supportive care reviewed as per instructions. cheratussin for cough. Red flags to suggest bacterial infection also reviewed. Pt agrees with plan.           Meds ordered this encounter    Medications  . guaiFENesin-codeine (CHERATUSSIN AC) 100-10 MG/5ML syrup    Sig: Take 5 mLs by mouth 2 (two) times daily as needed for cough (sedation precautions).    Dispense:  120 mL    Refill:  0   No orders of the defined types were placed in this encounter.  Patient Instructions  You have a viral upper respiratory infection. Antibiotics are not needed for this.  Viral infections usually take 7-10 days to resolve.  The cough can last a few weeks to go away. Use medication as prescribed: codeine cough syrup.  May continue mucinex with large glass of water to mobilize mucous.  Take ibuprofen 400-600mg  with meals for next 3-5 days for inflammation Push fluids and plenty of rest. Let us know if fever >101, worsening productive cough or not improving with treatment.  Call clinic with questions.  Good to see you today. I hope you start feeling better soon.    Follow up plan: Return if symptoms worsen or fail to improve.  Ria Bush, MD

## 2018-09-21 NOTE — Assessment & Plan Note (Addendum)
Anticipate viral. Supportive care reviewed as per instructions. cheratussin for cough. Red flags to suggest bacterial infection also reviewed. Pt agrees with plan.

## 2018-09-25 ENCOUNTER — Other Ambulatory Visit: Payer: Self-pay | Admitting: Internal Medicine

## 2018-09-25 DIAGNOSIS — E559 Vitamin D deficiency, unspecified: Secondary | ICD-10-CM

## 2018-10-25 ENCOUNTER — Other Ambulatory Visit: Payer: Self-pay | Admitting: Obstetrics and Gynecology

## 2018-10-25 DIAGNOSIS — D250 Submucous leiomyoma of uterus: Secondary | ICD-10-CM

## 2018-11-22 ENCOUNTER — Ambulatory Visit
Admission: RE | Admit: 2018-11-22 | Discharge: 2018-11-22 | Disposition: A | Discharge status: Home / Self Care | Payer: Managed Care, Other (non HMO) | Source: Ambulatory Visit | Attending: Obstetrics and Gynecology | Admitting: Obstetrics and Gynecology

## 2018-11-22 DIAGNOSIS — D250 Submucous leiomyoma of uterus: Secondary | ICD-10-CM

## 2018-11-22 HISTORY — PX: IR RADIOLOGIST EVAL & MGMT: IMG5224

## 2018-11-22 HISTORY — DX: Benign neoplasm of connective and other soft tissue, unspecified: D21.9

## 2018-11-22 NOTE — Consult Note (Signed)
Chief Complaint: Patient was seen in consultation today for  Chief Complaint  Patient presents with  . Fibroids    Consult for [place]     at the request of Cousins,Sheronette  Referring Physician(s): Cousins,Sheronette  History of Present Illness: Jo Goodwin is a 43 y.o. female with history of uterine fibroids and menorrhagia.  Patient has known fibroids based on ultrasound.  Patient was suffering from extensive menorrhagia back in 2017.  Patient started Junel in 2017 and stopped having menstrual periods. patient recently stopped the Junel in February 2020 due to concerns about high blood pressure and started on Camila.  After changing medications, the patient had approximately 3 weeks of menstrual bleeding with at least 1 week of very heavy flow and heavy clotting.  She had 2 episodes where she had to leave work due to the heavy bleeding.  Along with the heavy bleeding, patient has had frequent urinations for quite a while.  She gets up to urinate at least once a night and frequents the bathroom multiple times during the day.  In general, she feels like there is fullness in the pelvic region as well.  Pregnancy history is G3, P1.  She had 2 miscarriages.  Patient has no intentions of future pregnancy.  Past Medical History:  Diagnosis Date  . Anemia   . Fibroids   . Frequent headaches    otc med prn  . GERD (gastroesophageal reflux disease)    diet controlled, no meds  . SVD (spontaneous vaginal delivery)    x 1    Past Surgical History:  Procedure Laterality Date  . DILATION AND EVACUATION N/A 12/21/2015   Procedure: DILATATION AND EVACUATION;  Surgeon: Servando Salina, MD;  Location: [facility];  Service: Gynecology;  Laterality: N/A;  . NO PAST SURGERIES    . WISDOM TOOTH EXTRACTION      Allergies: Patient has no known allergies.  Medications: Prior to Admission medications   Medication Sig Start Date End Date Taking? Authorizing Provider  acetaminophen (TYLENOL)  500 MG tablet Take 1,000 mg by mouth every 6 (six) hours as needed for headache.   Yes [provider]  Cholecalciferol (VITAMIN D) 2000 units CAPS Take 1 capsule by mouth daily.   Yes [provider]  ferrous sulfate 325 (65 FE) MG tablet Take 325 mg by mouth daily with breakfast.   Yes [provider]  Multiple Vitamin (MULTIVITAMIN) tablet Take 1 tablet by mouth daily.   Yes [provider]  norethindrone (MICRONOR,CAMILA,ERRIN) 0.35 MG tablet Take 1 tablet by mouth daily.   Yes [provider]  guaiFENesin-codeine (CHERATUSSIN AC) 100-10 MG/5ML syrup Take 5 mLs by mouth 2 (two) times daily as needed for cough (sedation precautions). Patient not taking: Reported on 11/22/2018 09/21/18   Ria Bush, MD  Vitamin D, Ergocalciferol, (DRISDOL) 50000 units CAPS capsule Take 1 capsule (50,000 Units total) by mouth every 7 (seven) days. Patient not taking: Reported on 11/22/2018 07/07/18   Jearld Fenton, NP     Family History  Problem Relation Age of Onset  . Hypertension Father   . Diabetes Sister   . Hypertension Sister   . Diabetes Maternal Grandmother   . Diabetes Maternal Grandfather   . Diabetes Paternal Grandmother   . Hypertension Paternal Grandmother   . Diabetes Paternal Grandfather   . Hypertension Paternal Grandfather     Social History   Socioeconomic History  . Marital status: Single    Spouse name: Not on file  .  Number of children: Not on file  . Years of education: Not on file  . Highest education level: Not on file  Occupational History  . Not on file  Social Needs  . Financial resource strain: Not on file  . Food insecurity:    Worry: Not on file    Inability: Not on file  . Transportation needs:    Medical: Not on file    Non-medical: Not on file  Tobacco Use  . Smoking status: Never Smoker  . Smokeless tobacco: Never Used  Substance and Sexual Activity  . Alcohol use: Yes    Comment: rare   . Drug use:  No  . Sexual activity: Yes    Birth control/protection: None    Comment: approx [redacted] wks gestation  Lifestyle  . Physical activity:    Days per week: Not on file    Minutes per session: Not on file  . Stress: Not on file  Relationships  . Social connections:    Talks on phone: Not on file    Gets together: Not on file    Attends religious service: Not on file    Active member of club or organization: Not on file    Attends meetings of clubs or organizations: Not on file    Relationship status: Not on file  Other Topics Concern  . Not on file  Social History Narrative  . Not on file      Review of Systems  Gastrointestinal: Positive for abdominal distention.  Genitourinary: Positive for menstrual problem.    Vital Signs: BP (!) 145/98   Pulse 79   Temp 98.3 F (36.8 C) (Oral)   Resp 14   Ht 5\' 7"  (1.702 m)   Wt 109.8 kg   LMP 11/02/2018 (Exact Date)   SpO2 99%   BMI 37.90 kg/m   Physical Exam Constitutional:      Appearance: Normal appearance.  HENT:     Mouth/Throat:     Pharynx: Oropharynx is clear.  Cardiovascular:     Rate and Rhythm: Normal rate and regular rhythm.     Pulses: Normal pulses.     Heart sounds: Normal heart sounds.  Pulmonary:     Effort: Pulmonary effort is normal.     Breath sounds: Normal breath sounds.  Abdominal:     General: Abdomen is flat. Bowel sounds are normal.     Palpations: Abdomen is soft.     Tenderness: There is no abdominal tenderness.  Musculoskeletal:        General: No swelling.  Neurological:     Mental Status: She is alert.     Mallampati Score: 2     Imaging:  Pelvic ultrasound report from 10/01/2017.  Uterus measures 9.2 8.7 x 8.1 cm.  Retroverted position.  Normal appearance of the endometrium measuring roughly 7 mm.  Normal appearance of both ovaries.  2 large intramural fibroids noted, largest measuring up to 6 cm.  Labs:  CBC: Recent Labs    07/01/18 1634  WBC 8.0  HGB 14.6  HCT 42.9  PLT  285    COAGS: No results for input(s): INR, APTT in the last 8760 hours.  BMP: Recent Labs    07/01/18 1634  NA 139  K 4.8  CL 107  CO2 24  GLUCOSE 85  BUN 13  CALCIUM 9.3  CREATININE 1.02    LIVER FUNCTION TESTS: Recent Labs    07/01/18 1634  BILITOT 0.2  AST 14  ALT 10  PROT 7.0    TUMOR MARKERS: No results for input(s): AFPTM, CEA, CA199, CHROMGRNA in the last 8760 hours.  Assessment and Plan:  43 year old female with uterine fibroids and menorrhagia.  Heavy menstrual bleeding has restarted since she recently changed hormonal therapy.  We discussed different treatment options for uterine fibroids including hysterectomy and uterine artery embolization.  We spent most of the time discussing the uterine artery embolization procedure.  Patient has a good understanding of the risks and benefits of the procedure.  We discussed the post procedure recovery which included at least 1 night in the hospital and missing work for 1 to 2 weeks.  Patient would need a pelvic MRI and endometrial biopsy prior to the procedure.  We will need to get the results of the recent Pap smear.  I feel the patient would be a candidate for uterine artery embolization procedure based on her symptoms assuming that there is no contraindication based on a pelvic MRI.  Patient is undecided whether or not she would like to proceed with uterine artery embolization or if she would rather wait to see if the symptoms change or subside now that she has changed hormonal therapy.  She will contact us if she would like to proceed with the procedure and we can schedule her for the MRI and endometrial biopsy if needed.   Thank you for this interesting consult.  I greatly enjoyed meeting Jo Goodwin and look forward to participating in their care.  A copy of this report was sent to the requesting provider on this date.  Electronically Signed: Burman Riis 11/22/2018, 9:42 AM   I spent a total of  30 Minutes   in  face to face in clinical consultation, greater than 50% of which was counseling/coordinating care for uterine fibroids and menorrhagia.

## 2019-02-22 ENCOUNTER — Other Ambulatory Visit: Payer: Self-pay | Admitting: Diagnostic Radiology

## 2019-02-22 ENCOUNTER — Other Ambulatory Visit: Payer: Self-pay | Admitting: Obstetrics and Gynecology

## 2019-02-22 DIAGNOSIS — D250 Submucous leiomyoma of uterus: Secondary | ICD-10-CM

## 2019-03-13 ENCOUNTER — Ambulatory Visit
Admission: RE | Admit: 2019-03-13 | Discharge: 2019-03-13 | Disposition: A | Discharge status: Home / Self Care | Payer: Managed Care, Other (non HMO) | Source: Ambulatory Visit | Attending: Obstetrics and Gynecology | Admitting: Obstetrics and Gynecology

## 2019-03-13 DIAGNOSIS — D250 Submucous leiomyoma of uterus: Secondary | ICD-10-CM

## 2019-03-13 MED ORDER — GADOBENATE DIMEGLUMINE 529 MG/ML IV SOLN
20.0000 mL | Freq: Once | INTRAVENOUS | Status: AC | PRN
  Administered 2019-03-13: 20 mL via INTRAVENOUS

## 2019-03-27 ENCOUNTER — Telehealth: Payer: Self-pay | Admitting: Internal Medicine

## 2019-03-27 NOTE — Telephone Encounter (Signed)
Yes, she needs a virtual visit

## 2019-03-27 NOTE — Telephone Encounter (Signed)
Patient called today about being Tested for COVID. She stated that her nephew has tested positive.  And she was around him about 2-3 weeks ago She wanted to be tested to be on the safe side.  Is this something she needs to schedule a virtual visit to discussed   C/B # [phone]

## 2019-03-28 ENCOUNTER — Telehealth: Payer: Self-pay | Admitting: *Deleted

## 2019-03-28 ENCOUNTER — Other Ambulatory Visit: Payer: Self-pay

## 2019-03-28 ENCOUNTER — Encounter: Payer: Self-pay | Admitting: Internal Medicine

## 2019-03-28 ENCOUNTER — Other Ambulatory Visit: Payer: Managed Care, Other (non HMO)

## 2019-03-28 ENCOUNTER — Ambulatory Visit (INDEPENDENT_AMBULATORY_CARE_PROVIDER_SITE_OTHER): Payer: Managed Care, Other (non HMO) | Admitting: Internal Medicine

## 2019-03-28 VITALS — BP 128/84 | Temp 97.4°F | Wt 246.0 lb

## 2019-03-28 DIAGNOSIS — Z20822 Contact with and (suspected) exposure to covid-19: Secondary | ICD-10-CM

## 2019-03-28 DIAGNOSIS — Z20828 Contact with and (suspected) exposure to other viral communicable diseases: Secondary | ICD-10-CM

## 2019-03-28 DIAGNOSIS — J029 Acute pharyngitis, unspecified: Secondary | ICD-10-CM

## 2019-03-28 DIAGNOSIS — R519 Headache, unspecified: Secondary | ICD-10-CM

## 2019-03-28 DIAGNOSIS — R51 Headache: Secondary | ICD-10-CM | POA: Diagnosis not present

## 2019-03-28 DIAGNOSIS — J3489 Other specified disorders of nose and nasal sinuses: Secondary | ICD-10-CM

## 2019-03-28 NOTE — Patient Instructions (Signed)
COVID-19 Frequently Asked Questions °COVID-19 (coronavirus disease) is an infection that is caused by a large family of viruses. Some viruses cause illness in people and others cause illness in animals like camels, cats, and bats. In some cases, the viruses that cause illness in animals can spread to humans. °Where did the coronavirus come from? °In December 2019, [place] told the World Health Organization (WHO) of several cases of lung disease (human respiratory illness). These cases were linked to an open seafood and livestock market in the city of [place]. The link to the seafood and livestock market suggests that the virus may have spread from animals to humans. However, since that first outbreak in December, the virus has also been shown to spread from person to person. °What is the name of the disease and the virus? °Disease name °Early on, this disease was called novel coronavirus. This is because scientists determined that the disease was caused by a new (novel) respiratory virus. The World Health Organization (WHO) has now named the disease COVID-19, or coronavirus disease. °Virus name °The virus that causes the disease is called severe acute respiratory syndrome coronavirus 2 (SARS-CoV-2). °More information on disease and virus naming °World Health Organization (WHO): www.who.int/emergencies/diseases/novel-coronavirus-2019/technical-guidance/naming-the-coronavirus-disease-(covid-2019)-and-the-virus-that-causes-it °Who is at risk for complications from coronavirus disease? °Some people may be at higher risk for complications from coronavirus disease. This includes older adults and people who have chronic diseases, such as heart disease, diabetes, and lung disease. °If you are at higher risk for complications, take these extra precautions: °· Avoid close contact with people who are sick or have a fever or cough. Stay at least 3-6 ft (1-2 m) away from them, if possible. °· Wash your hands often with soap and  water for at least 20 seconds. °· Avoid touching your face, mouth, nose, or eyes. °· Keep supplies on hand at home, such as food, medicine, and cleaning supplies. °· Stay home as much as possible. °· Avoid social gatherings and travel. °How does coronavirus disease spread? °The virus that causes coronavirus disease spreads easily from person to person (is contagious). There are also cases of community-spread disease. This means the disease has spread to: °· People who have no known contact with other infected people. °· People who have not traveled to areas where there are known cases. °It appears to spread from one person to another through droplets from coughing or sneezing. °Can I get the virus from touching surfaces or objects? °There is still a lot that we do not know about the virus that causes coronavirus disease. Scientists are basing a lot of information on what they know about similar viruses, such as: °· Viruses cannot generally survive on surfaces for long. They need a human body (host) to survive. °· It is more likely that the virus is spread by close contact with people who are sick (direct contact), such as through: °? Shaking hands or hugging. °? Breathing in respiratory droplets that travel through the air. This can happen when an infected person coughs or sneezes on or near other people. °· It is less likely that the virus is spread when a person touches a surface or object that has the virus on it (indirect contact). The virus may be able to enter the body if the person touches a surface or object and then touches his or her face, eyes, nose, or mouth. °Can a person spread the virus without having symptoms of the disease? °It may be possible for the virus to spread before a person   has symptoms of the disease, but this is most likely not the main way the virus is spreading. It is more likely for the virus to spread by being in close contact with people who are sick and breathing in the respiratory  droplets of a sick person's cough or sneeze. °What are the symptoms of coronavirus disease? °Symptoms vary from person to person and can range from mild to severe. Symptoms may include: °· Fever. °· Cough. °· Tiredness, weakness, or fatigue. °· Fast breathing or feeling short of breath. °These symptoms can appear anywhere from 2 to 14 days after you have been exposed to the virus. If you develop symptoms, call your health care provider. People with severe symptoms may need hospital care. °If I am exposed to the virus, how long does it take before symptoms start? °Symptoms of coronavirus disease may appear anywhere from 2 to 14 days after a person has been exposed to the virus. If you develop symptoms, call your health care provider. °Should I be tested for this virus? °Your health care provider will decide whether to test you based on your symptoms, history of exposure, and your risk factors. °How does a health care provider test for this virus? °Health care providers will collect samples to send for testing. Samples may include: °· Taking a swab of fluid from the nose. °· Taking fluid from the lungs by having you cough up mucus (sputum) into a sterile cup. °· Taking a blood sample. °· Taking a stool or urine sample. °Is there a treatment or vaccine for this virus? °Currently, there is no vaccine to prevent coronavirus disease. Also, there are no medicines like antibiotics or antivirals to treat the virus. A person who becomes sick is given supportive care, which means rest and fluids. A person may also relieve his or her symptoms by using over-the-counter medicines that treat sneezing, coughing, and runny nose. These are the same medicines that a person takes for the common cold. °If you develop symptoms, call your health care provider. People with severe symptoms may need hospital care. °What can I do to protect myself and my family from this virus? ° °  ° °You can protect yourself and your family by taking the  same actions that you would take to prevent the spread of other viruses. Take the following actions: °· Wash your hands often with soap and water for at least 20 seconds. If soap and water are not available, use alcohol-based hand sanitizer. °· Avoid touching your face, mouth, nose, or eyes. °· Cough or sneeze into a tissue, sleeve, or elbow. Do not cough or sneeze into your hand or the air. °? If you cough or sneeze into a tissue, throw it away immediately and wash your hands. °· Disinfect objects and surfaces that you frequently touch every day. °· Avoid close contact with people who are sick or have a fever or cough. Stay at least 3-6 ft (1-2 m) away from them, if possible. °· Stay home if you are sick, except to get medical care. Call your health care provider before you get medical care. °· Make sure your vaccines are up to date. Ask your health care provider what vaccines you need. °What should I do if I need to travel? °Follow travel recommendations from your local health authority, the CDC, and WHO. °Travel information and advice °· Centers for Disease Control and Prevention (CDC): www.cdc.gov/coronavirus/2019-ncov/travelers/index.html °· World Health Organization (WHO): www.who.int/emergencies/diseases/novel-coronavirus-2019/travel-advice °Know the risks and take action to protect your health °·   You are at higher risk of getting coronavirus disease if you are traveling to areas with an outbreak or if you are exposed to travelers from areas with an outbreak. °· Wash your hands often and practice good hygiene to lower the risk of catching or spreading the virus. °What should I do if I am sick? °General instructions to stop the spread of infection °· Wash your hands often with soap and water for at least 20 seconds. If soap and water are not available, use alcohol-based hand sanitizer. °· Cough or sneeze into a tissue, sleeve, or elbow. Do not cough or sneeze into your hand or the air. °· If you cough or  sneeze into a tissue, throw it away immediately and wash your hands. °· Stay home unless you must get medical care. Call your health care provider or local health authority before you get medical care. °· Avoid public areas. Do not take public transportation, if possible. °· If you can, wear a mask if you must go out of the house or if you are in close contact with someone who is not sick. °Keep your home clean °· Disinfect objects and surfaces that are frequently touched every day. This may include: °? Counters and tables. °? Doorknobs and light switches. °? Sinks and faucets. °? Electronics such as phones, remote controls, keyboards, computers, and tablets. °· Wash dishes in hot, soapy water or use a dishwasher. Air-dry your dishes. °· Wash laundry in hot water. °Prevent infecting other household members °· Let healthy household members care for children and pets, if possible. If you have to care for children or pets, wash your hands often and wear a mask. °· Sleep in a different bedroom or bed, if possible. °· Do not share personal items, such as razors, toothbrushes, deodorant, combs, brushes, towels, and washcloths. °Where to find more information °Centers for Disease Control and Prevention (CDC) °· Information and news updates: www.cdc.gov/coronavirus/2019-ncov °World Health Organization (WHO) °· Information and news updates: www.who.int/emergencies/diseases/novel-coronavirus-2019 °· Coronavirus health topic: www.who.int/health-topics/coronavirus °· Questions and answers on COVID-19: www.who.int/news-room/q-a-detail/q-a-coronaviruses °· Global tracker: who.sprinklr.com °American Academy of Pediatrics (AAP) °· Information for families: www.healthychildren.org/English/health-issues/conditions/chest-lungs/Pages/2019-Novel-Coronavirus.aspx °The coronavirus situation is changing rapidly. Check your local health authority website or the CDC and WHO websites for updates and news. °When should I contact a health care  provider? °· Contact your health care provider if you have symptoms of an infection, such as fever or cough, and you: °? Have been near anyone who is known to have coronavirus disease. °? Have come into contact with a person who is suspected to have coronavirus disease. °? Have traveled outside of the country. °When should I get emergency medical care? °· Get help right away by calling your local emergency services (911 in the [place] if you have: °? Trouble breathing. °? Pain or pressure in your chest. °? Confusion. °? Blue-tinged lips and fingernails. °? Difficulty waking from sleep. °? Symptoms that get worse. °Let the emergency medical personnel know if you think you have coronavirus disease. °Summary °· A new respiratory virus is spreading from person to person and causing COVID-19 (coronavirus disease). °· The virus that causes COVID-19 appears to spread easily. It spreads from one person to another through droplets from coughing or sneezing. °· Older adults and those with chronic diseases are at higher risk of disease. If you are at higher risk for complications, take extra precautions. °· There is currently no vaccine to prevent coronavirus disease. There are no medicines, such as antibiotics or   antivirals, to treat the virus. °· You can protect yourself and your family by washing your hands often, avoiding touching your face, and covering your coughs and sneezes. °This information is not intended to replace advice given to you by your health care provider. Make sure you discuss any questions you have with your health care provider. °Document Released: 12/27/2018 Document Revised: 12/27/2018 Document Reviewed: 12/27/2018 °[employer]vier Patient Education © 2020 [employer] ° °

## 2019-03-28 NOTE — Progress Notes (Signed)
Virtual Visit via Video Note  I connected with Jo Goodwin on 03/28/19 at  8:00 AM EDT by a video enabled telemedicine application and verified that I am speaking with the correct person using two identifiers.  Location: Patient: Home Provider: Office   I discussed the limitations of evaluation and management by telemedicine and the availability of in person appointments. The patient expressed understanding and agreed to proceed.  History of Present Illness:  Pt reports headache, runny nose, nasal congestion and sore throat. She reports this started 1 week ago. The headache is located all over. She describes the pain as achy and throbbing. She denies dizziness or visual changes .She is blowing clear mucous out of her nose. She denies difficulty swallowing, reports the sore throat is more scratchy. She denies ear pain, cough or shortness of breath. She denies fever, chills or body aches. She has tried Copywriter, advertising with some relief. She reports her nephew has COVID 3 and she would like to be tested today due to exposure.    Past Medical History:  Diagnosis Date  . Anemia   . Fibroids   . Frequent headaches    otc med prn  . GERD (gastroesophageal reflux disease)    diet controlled, no meds  . SVD (spontaneous vaginal delivery)    x 1    Current Outpatient Medications  Medication Sig Dispense Refill  . acetaminophen (TYLENOL) 500 MG tablet Take 1,000 mg by mouth every 6 (six) hours as needed for headache.    . Cholecalciferol (VITAMIN D) 2000 units CAPS Take 1 capsule by mouth daily.    . ferrous sulfate 325 (65 FE) MG tablet Take 325 mg by mouth daily with breakfast.    . megestrol (MEGACE) 40 MG tablet Take 40 mg by mouth daily.    . Multiple Vitamin (MULTIVITAMIN) tablet Take 1 tablet by mouth daily.     No current facility-administered medications for this visit.     No Known Allergies  Family History  Problem Relation Age of Onset  . Hypertension Father   . Diabetes  Sister   . Hypertension Sister   . Diabetes Maternal Grandmother   . Diabetes Maternal Grandfather   . Diabetes Paternal Grandmother   . Hypertension Paternal Grandmother   . Diabetes Paternal Grandfather   . Hypertension Paternal Grandfather     Social History   Socioeconomic History  . Marital status: Single    Spouse name: Not on file  . Number of children: Not on file  . Years of education: Not on file  . Highest education level: Not on file  Occupational History  . Not on file  Social Needs  . Financial resource strain: Not on file  . Food insecurity    Worry: Not on file    Inability: Not on file  . Transportation needs    Medical: Not on file    Non-medical: Not on file  Tobacco Use  . Smoking status: Never Smoker  . Smokeless tobacco: Never Used  Substance and Sexual Activity  . Alcohol use: Yes    Comment: rare   . Drug use: No  . Sexual activity: Yes    Birth control/protection: None    Comment: approx [redacted] wks gestation  Lifestyle  . Physical activity    Days per week: Not on file    Minutes per session: Not on file  . Stress: Not on file  Relationships  . Social Herbalist on phone: Not  on file    Gets together: Not on file    Attends religious service: Not on file    Active member of club or organization: Not on file    Attends meetings of clubs or organizations: Not on file    Relationship status: Not on file  . Intimate partner violence    Fear of current or ex partner: Not on file    Emotionally abused: Not on file    Physically abused: Not on file    Forced sexual activity: Not on file  Other Topics Concern  . Not on file  Social History Narrative  . Not on file     Constitutional: Pt reports headache. Denies fever, malaise, fatigue, or abrupt weight changes.  HEENT: Pt reports runny nose, nasal congestion, sore throat. Denies eye pain, eye redness, ear pain, ringing in the ears, wax buildup, bloody nose. Respiratory: Denies  difficulty breathing, shortness of breath, cough or sputum production.   Cardiovascular: Denies chest pain, chest tightness, palpitations or swelling in the hands or feet.  Neurological: Denies dizziness, difficulty with memory, difficulty with speech or problems with balance and coordination.   No other specific complaints in a complete review of systems (except as listed in HPI above).  Observations/Objective:  BP 128/84   Temp (!) 97.4 F (36.3 C)   Wt 246 lb (111.6 kg)   BMI 38.53 kg/m  Wt Readings from Last 3 Encounters:  03/28/19 246 lb (111.6 kg)  11/22/18 242 lb (109.8 kg)  09/21/18 244 lb 12 oz (111 kg)    General: Appears her stated age, well developed, well nourished in NAD. Skin: Warm, dry and intact. No rashes noted. HEENT: Head: normal shape and size; Pulmonary/Chest: Normal effort. No respiratory distress.  Neurological: Alert and oriented.    BMET    Component Value Date/Time   NA 139 07/01/2018 1634   K 4.8 07/01/2018 1634   CL 107 07/01/2018 1634   CO2 24 07/01/2018 1634   GLUCOSE 85 07/01/2018 1634   BUN 13 07/01/2018 1634   CREATININE 1.02 07/01/2018 1634   CALCIUM 9.3 07/01/2018 1634    Lipid Panel     Component Value Date/Time   CHOL 155 07/01/2018 1634   TRIG 92 07/01/2018 1634   HDL 34 (L) 07/01/2018 1634   CHOLHDL 4.6 07/01/2018 1634   VLDL 15.0 05/27/2017 1455   LDLCALC 102 (H) 07/01/2018 1634    CBC    Component Value Date/Time   WBC 8.0 07/01/2018 1634   RBC 5.17 (H) 07/01/2018 1634   HGB 14.6 07/01/2018 1634   HCT 42.9 07/01/2018 1634   PLT 285 07/01/2018 1634   MCV 83.0 07/01/2018 1634   MCH 28.2 07/01/2018 1634   MCHC 34.0 07/01/2018 1634   RDW 12.5 07/01/2018 1634   LYMPHSABS 1.7 01/24/2015 1104   MONOABS 0.5 01/24/2015 1104   EOSABS 0.2 01/24/2015 1104   BASOSABS 0.0 01/24/2015 1104    Hgb A1C Lab Results  Component Value Date   HGBA1C 5.3 07/01/2018       Assessment and Plan:  Headache, Runny Nose, Nasal  Congestion, Sore Throat, Exposure to COVID 19:  Will have her proceed to a drive up test site for Novel Coronavirus testing- advised she would be contacted by the [facility] to set this up Discussed the importance of hand washing, mouth covering and social distancing Advised her she would need to self quarantine until results come back In the meantime, would advise antihistamine and steroid nasal spray  OTC or can continue Alka Seltzer if it seems to be helping.  Will follow up after test results are back, return precautions discussed  Follow Up Instructions:    I discussed the assessment and treatment plan with the patient. The patient was provided an opportunity to ask questions and all were answered. The patient agreed with the plan and demonstrated an understanding of the instructions.   The patient was advised to call back or seek an in-person evaluation if the symptoms worsen or if the condition fails to improve as anticipated.    Webb Silversmith, NP

## 2019-03-28 NOTE — Telephone Encounter (Signed)
Scheduled patient for COVID 19 test today at 11:15 am at [facility].  Testing protocol reviewed with patient.

## 2019-03-28 NOTE — Telephone Encounter (Signed)
-----   Message from Jearld Fenton, NP sent at 03/28/2019  8:30 AM EDT ----- Needs COVID 19 testing. Headache, nasal congestion, runny nose and sore throat. Positive close exposure.

## 2019-03-31 LAB — NOVEL CORONAVIRUS, NAA: SARS-CoV-2, NAA: NOT DETECTED

## 2019-06-09 ENCOUNTER — Telehealth: Payer: Self-pay | Admitting: Internal Medicine

## 2019-06-09 NOTE — Telephone Encounter (Signed)
Left message asking pt to call office  °

## 2019-06-09 NOTE — Telephone Encounter (Signed)
As long as it is not back to back with another CPE, also her last CPE was 07/01/2018

## 2019-06-09 NOTE — Telephone Encounter (Signed)
Best number [phone]  Pt needs cpx biometic screening before 10/22 .  Can I work her in for cpx

## 2019-06-12 NOTE — Telephone Encounter (Signed)
Appointment 10/20

## 2019-07-04 ENCOUNTER — Ambulatory Visit (INDEPENDENT_AMBULATORY_CARE_PROVIDER_SITE_OTHER): Payer: Managed Care, Other (non HMO) | Admitting: Internal Medicine

## 2019-07-04 ENCOUNTER — Encounter: Payer: Self-pay | Admitting: Internal Medicine

## 2019-07-04 ENCOUNTER — Other Ambulatory Visit: Payer: Self-pay

## 2019-07-04 VITALS — BP 136/100 | HR 78 | Temp 98.2°F | Ht 67.5 in | Wt 256.0 lb

## 2019-07-04 DIAGNOSIS — Z0000 Encounter for general adult medical examination without abnormal findings: Secondary | ICD-10-CM | POA: Diagnosis not present

## 2019-07-04 DIAGNOSIS — R519 Headache, unspecified: Secondary | ICD-10-CM | POA: Insufficient documentation

## 2019-07-04 DIAGNOSIS — K219 Gastro-esophageal reflux disease without esophagitis: Secondary | ICD-10-CM

## 2019-07-04 DIAGNOSIS — I10 Essential (primary) hypertension: Secondary | ICD-10-CM | POA: Insufficient documentation

## 2019-07-04 DIAGNOSIS — D500 Iron deficiency anemia secondary to blood loss (chronic): Secondary | ICD-10-CM | POA: Diagnosis not present

## 2019-07-04 DIAGNOSIS — D649 Anemia, unspecified: Secondary | ICD-10-CM | POA: Insufficient documentation

## 2019-07-04 DIAGNOSIS — I158 Other secondary hypertension: Secondary | ICD-10-CM

## 2019-07-04 DIAGNOSIS — N920 Excessive and frequent menstruation with regular cycle: Secondary | ICD-10-CM

## 2019-07-04 LAB — LIPID PANEL
Cholesterol: 149 mg/dL (ref 0–200)
HDL: 30.9 mg/dL — ABNORMAL LOW (ref >39.00)
LDL Cholesterol: 101 mg/dL — ABNORMAL HIGH (ref 0–99)
NonHDL: 118.15
Total CHOL/HDL Ratio: 5
Triglycerides: 87 mg/dL (ref 0.0–149.0)
VLDL: 17.4 mg/dL (ref 0.0–40.0)

## 2019-07-04 LAB — CBC
HCT: 42.5 % (ref 36.0–46.0)
Hemoglobin: 13.6 g/dL (ref 12.0–15.0)
MCHC: 31.9 g/dL (ref 30.0–36.0)
MCV: 77.9 fl — ABNORMAL LOW (ref 78.0–100.0)
Platelets: 295 10*3/uL (ref 150.0–400.0)
RBC: 5.46 Mil/uL — ABNORMAL HIGH (ref 3.87–5.11)
RDW: 16.5 % — ABNORMAL HIGH (ref 11.5–15.5)
WBC: 7.6 10*3/uL (ref 4.0–10.5)

## 2019-07-04 LAB — COMPREHENSIVE METABOLIC PANEL
ALT: 17 U/L (ref 0–35)
AST: 16 U/L (ref 0–37)
Albumin: 4.2 g/dL (ref 3.5–5.2)
Alkaline Phosphatase: 86 U/L (ref 39–117)
BUN: 9 mg/dL (ref 6–23)
CO2: 28 mEq/L (ref 19–32)
Calcium: 9.5 mg/dL (ref 8.4–10.5)
Chloride: 105 mEq/L (ref 96–112)
Creatinine, Ser: 1.06 mg/dL (ref 0.40–1.20)
GFR: 68.3 mL/min (ref >60.00)
Glucose, Bld: 94 mg/dL (ref 70–99)
Potassium: 4.4 mEq/L (ref 3.5–5.1)
Sodium: 139 mEq/L (ref 135–145)
Total Bilirubin: 0.3 mg/dL (ref 0.2–1.2)
Total Protein: 7.3 g/dL (ref 6.0–8.3)

## 2019-07-04 LAB — HEMOGLOBIN A1C: Hgb A1c MFr Bld: 5.9 % (ref 4.6–6.5)

## 2019-07-04 LAB — VITAMIN D 25 HYDROXY (VIT D DEFICIENCY, FRACTURES): VITD: 18.25 ng/mL — ABNORMAL LOW (ref 30.00–100.00)

## 2019-07-04 LAB — TSH: TSH: 0.63 u[IU]/mL (ref 0.35–4.50)

## 2019-07-04 MED ORDER — AMLODIPINE BESYLATE 5 MG PO TABS: 5.0000 mg | ORAL_TABLET | Freq: Every day | ORAL | 0 refills | Status: DC

## 2019-07-04 NOTE — Assessment & Plan Note (Signed)
Discussed how weight loss could help improve symptoms Continue Tums prn CBC and CMET today

## 2019-07-04 NOTE — Assessment & Plan Note (Signed)
Discussed stress management techniques Continue Tylenol, Excedrin Migraine prn

## 2019-07-04 NOTE — Assessment & Plan Note (Signed)
CBC today Continue iron supplement OTC 

## 2019-07-04 NOTE — Assessment & Plan Note (Signed)
Due to fibroids Follows with GYN in [place], thinking about hysterectomy I really don't think Megace is good for her, but she wants to continue for now

## 2019-07-04 NOTE — Assessment & Plan Note (Signed)
New onset Will trial Amlodipine 5 mg daily Reinforced DASH diet and exercise for weight loss CMET today

## 2019-07-04 NOTE — Progress Notes (Addendum)
Subjective:    Patient ID: Jo Goodwin, female    DOB: 1976-04-19, 43 y.o.   MRN: [id number]  HPI  Pt presents to the clinic today for her annual exam. She is also due to follow up heavy periods.  Menorrhagia: Secondary to fibroids. She is currently on Megace prescribed by her GYN. She has incurred a 12 lb weight gain secondary to this. She is currently seeing a GYN in [place] for a second opinion, possible hysterectomy..  Anemia Secondary to Chronic Blood Loss: She is taking an iron supplement OTC. She denies s/s of heavy bleeding at this time. She plans on having a hysterectomy.  GERD: Worse with recent weight gain. She takes Tums as needed with good relief of symptoms. Theres is no upper GI on file.   Frequent Headaches: Triggered by stress. She takes Tylenol or Excedrin Migraine as needed with good relief of symptoms.   HTN: Her BP is 136/100. She denies headache, dizziness, chest pain or shortness of breath. She has not had high BP until she started contraception for her heavy menses.  Diet: She does eat meat. She consumes some fruits and veggies. She does eat some fried foods. She drinks mostly coffee, water, unsweet tea, some soda. Exercise: None  Review of Systems      Past Medical History:  Diagnosis Date  . Anemia   . Fibroids   . Frequent headaches    otc med prn  . GERD (gastroesophageal reflux disease)    diet controlled, no meds  . SVD (spontaneous vaginal delivery)    x 1    Current Outpatient Medications  Medication Sig Dispense Refill  . acetaminophen (TYLENOL) 500 MG tablet Take 1,000 mg by mouth every 6 (six) hours as needed for headache.    . Cholecalciferol (VITAMIN D) 2000 units CAPS Take 1 capsule by mouth daily.    . ferrous sulfate 325 (65 FE) MG tablet Take 325 mg by mouth daily with breakfast.    . megestrol (MEGACE) 40 MG tablet Take 40 mg by mouth daily.    . Multiple Vitamin (MULTIVITAMIN) tablet Take 1 tablet by mouth daily.      No current facility-administered medications for this visit.     No Known Allergies  Family History  Problem Relation Age of Onset  . Hypertension Father   . Diabetes Sister   . Hypertension Sister   . Diabetes Maternal Grandmother   . Diabetes Maternal Grandfather   . Diabetes Paternal Grandmother   . Hypertension Paternal Grandmother   . Diabetes Paternal Grandfather   . Hypertension Paternal Grandfather     Social History   Socioeconomic History  . Marital status: Single    Spouse name: Not on file  . Number of children: Not on file  . Years of education: Not on file  . Highest education level: Not on file  Occupational History  . Not on file  Social Needs  . Financial resource strain: Not on file  . Food insecurity    Worry: Not on file    Inability: Not on file  . Transportation needs    Medical: Not on file    Non-medical: Not on file  Tobacco Use  . Smoking status: Never Smoker  . Smokeless tobacco: Never Used  Substance and Sexual Activity  . Alcohol use: Yes    Comment: rare   . Drug use: No  . Sexual activity: Yes    Birth control/protection: None    Comment: approx  [redacted] wks gestation  Lifestyle  . Physical activity    Days per week: Not on file    Minutes per session: Not on file  . Stress: Not on file  Relationships  . Social Herbalist on phone: Not on file    Gets together: Not on file    Attends religious service: Not on file    Active member of club or organization: Not on file    Attends meetings of clubs or organizations: Not on file    Relationship status: Not on file  . Intimate partner violence    Fear of current or ex partner: Not on file    Emotionally abused: Not on file    Physically abused: Not on file    Forced sexual activity: Not on file  Other Topics Concern  . Not on file  Social History Narrative  . Not on file     Constitutional: Pt reports weight gain, intermittent headaches. Denies fever, malaise,  fatigue.  HEENT: Denies eye pain, eye redness, ear pain, ringing in the ears, wax buildup, runny nose, nasal congestion, bloody nose, or sore throat. Respiratory: Denies difficulty breathing, shortness of breath, cough or sputum production.   Cardiovascular: Denies chest pain, chest tightness, palpitations or swelling in the hands or feet.  Gastrointestinal: Pt reports intermittent reflux. Denies abdominal pain, bloating, constipation, diarrhea or blood in the stool.  GU: Denies urgency, frequency, pain with urination, burning sensation, blood in urine, odor or discharge. Musculoskeletal: Denies decrease in range of motion, difficulty with gait, muscle pain or joint pain and swelling.  Skin: Denies redness, rashes, lesions or ulcercations.  Neurological: Denies dizziness, difficulty with memory, difficulty with speech or problems with balance and coordination.  Psych: Pt reports stress. Denies anxiety, depression, SI/HI.  No other specific complaints in a complete review of systems (except as listed in HPI above).  Objective:   Physical Exam   BP (!) 136/100   Pulse 78   Temp 98.2 F (36.8 C) (Temporal)   Ht 5' 7.5" (1.715 m)   Wt 256 lb (116.1 kg)   SpO2 98%   BMI 39.50 kg/m  Wt Readings from Last 3 Encounters:  07/04/19 256 lb (116.1 kg)  03/28/19 246 lb (111.6 kg)  11/22/18 242 lb (109.8 kg)    General: Appears her stated age, obese,in NAD. Skin: Warm, dry and intact. No rashesnoted. HEENT: Head: normal shape and size; Eyes: sclera white, no icterus, conjunctiva pink, PERRLA and EOMs intact; Ears: Tm's gray and intact, normal light reflex;  Neck:  Neck supple, trachea midline. No masses, lumps or thyromegaly present.  Cardiovascular: Normal rate and rhythm. S1,S2 noted.  No murmur, rubs or gallops noted. No JVD or BLE edema.  Pulmonary/Chest: Normal effort and positive vesicular breath sounds. No respiratory distress. No wheezes, rales or ronchi noted.  Abdomen: Soft and  nontender. Normal bowel sounds. No distention or masses noted. Liver, spleen and kidneys non palpable. Musculoskeletal: Strength 5/5 BUE/BLE. No difficulty with gait.  Neurological: Alert and oriented. Cranial nerves II-XII grossly intact. Coordination normal.  Psychiatric: Mood and affect normal. Behavior is normal. Judgment and thought content normal.     BMET    Component Value Date/Time   NA 139 07/01/2018 1634   K 4.8 07/01/2018 1634   CL 107 07/01/2018 1634   CO2 24 07/01/2018 1634   GLUCOSE 85 07/01/2018 1634   BUN 13 07/01/2018 1634   CREATININE 1.02 07/01/2018 1634   CALCIUM 9.3 07/01/2018  1634    Lipid Panel     Component Value Date/Time   CHOL 155 07/01/2018 1634   TRIG 92 07/01/2018 1634   HDL 34 (L) 07/01/2018 1634   CHOLHDL 4.6 07/01/2018 1634   VLDL 15.0 05/27/2017 1455   LDLCALC 102 (H) 07/01/2018 1634    CBC    Component Value Date/Time   WBC 8.0 07/01/2018 1634   RBC 5.17 (H) 07/01/2018 1634   HGB 14.6 07/01/2018 1634   HCT 42.9 07/01/2018 1634   PLT 285 07/01/2018 1634   MCV 83.0 07/01/2018 1634   MCH 28.2 07/01/2018 1634   MCHC 34.0 07/01/2018 1634   RDW 12.5 07/01/2018 1634   LYMPHSABS 1.7 01/24/2015 1104   MONOABS 0.5 01/24/2015 1104   EOSABS 0.2 01/24/2015 1104   BASOSABS 0.0 01/24/2015 1104    Hgb A1C Lab Results  Component Value Date   HGBA1C 5.3 07/01/2018           Assessment & Plan:   Preventative Health Maintenance:  She declines flu shot today Tetanus UTD Pap smear UTD Mammogram UTD per her report- will request copy from [facility] Encouraged her to consume a balanced diet and exercise regimen Advised her to see an eye doctor and dentist annually Will check CBC, CMET, TSH, Lipid, A1c and Vit D today  RTC in 3 weeks for follow up of HTN Webb Silversmith, NP

## 2019-07-06 ENCOUNTER — Telehealth: Payer: Self-pay

## 2019-07-06 NOTE — Telephone Encounter (Signed)
Spoke to pt to let her know that her form is ready for pick up along with a copy of results.... I briefly went over results as she needs a copy to go with her employer form... I did let her know they had not been interpreted yet and she will get a call once that was done and she may need Rx Vitamin D

## 2019-07-07 NOTE — Telephone Encounter (Signed)
See result note.  

## 2019-07-14 ENCOUNTER — Telehealth: Payer: Self-pay | Admitting: Internal Medicine

## 2019-07-14 MED ORDER — VITAMIN D (ERGOCALCIFEROL) 1.25 MG (50000 UNIT) PO CAPS: 50000.0000 [IU] | ORAL_CAPSULE | ORAL | 0 refills | Status: DC

## 2019-07-14 NOTE — Telephone Encounter (Signed)
Pt called checking on rx for vit d  [facility]

## 2019-07-14 NOTE — Telephone Encounter (Signed)
Rx sent through [facility]  

## 2019-07-27 ENCOUNTER — Other Ambulatory Visit: Payer: Self-pay | Admitting: Internal Medicine

## 2019-08-07 ENCOUNTER — Telehealth: Payer: Self-pay

## 2019-08-07 NOTE — Telephone Encounter (Signed)
Patient called checking to see if she needed an appointment because her HCTZ needs to be refilled. Reviewed previous notes and advised patient she does need a f/u on HTN and appointment was scheduled.

## 2019-08-09 ENCOUNTER — Ambulatory Visit (INDEPENDENT_AMBULATORY_CARE_PROVIDER_SITE_OTHER): Payer: Managed Care, Other (non HMO) | Admitting: Internal Medicine

## 2019-08-09 ENCOUNTER — Other Ambulatory Visit: Payer: Self-pay

## 2019-08-09 ENCOUNTER — Encounter: Payer: Self-pay | Admitting: Internal Medicine

## 2019-08-09 DIAGNOSIS — I158 Other secondary hypertension: Secondary | ICD-10-CM | POA: Diagnosis not present

## 2019-08-09 MED ORDER — AMLODIPINE BESYLATE 10 MG PO TABS: 10.0000 mg | ORAL_TABLET | Freq: Every day | ORAL | 2 refills | Status: DC

## 2019-08-09 NOTE — Progress Notes (Signed)
Subjective:    Patient ID: Jo Goodwin, female    DOB: November 21, 1975, 43 y.o.   MRN: [id number]  HPI  Pt presents to the clinic today for follow up of HTN. At her last visit, she was started on Amlodipine 5 mg and BP was 136/100. She has been taking the medication as prescribed and denies adverse side effects. Her BP today is 134/94. She denies any blurry vision, HA or swelling.  ECG from 01/2015 reviewed.   Review of Systems   Past Medical History:  Diagnosis Date  . Anemia   . Fibroids   . Frequent headaches    otc med prn  . GERD (gastroesophageal reflux disease)    diet controlled, no meds  . SVD (spontaneous vaginal delivery)    x 1    Current Outpatient Medications  Medication Sig Dispense Refill  . acetaminophen (TYLENOL) 500 MG tablet Take 1,000 mg by mouth every 6 (six) hours as needed for headache.    Marland Kitchen amLODipine (NORVASC) 5 MG tablet Take 1 tablet (5 mg total) by mouth daily. 30 tablet 0  . Cholecalciferol (VITAMIN D) 2000 units CAPS Take 1 capsule by mouth daily.    . ferrous sulfate 325 (65 FE) MG tablet Take 325 mg by mouth daily with breakfast.    . megestrol (MEGACE) 40 MG tablet Take 40 mg by mouth daily.    . Multiple Vitamin (MULTIVITAMIN) tablet Take 1 tablet by mouth daily.    . Vitamin D, Ergocalciferol, (DRISDOL) 1.25 MG (50000 UT) CAPS capsule Take 1 capsule (50,000 Units total) by mouth every 7 (seven) days. 12 capsule 0   No current facility-administered medications for this visit.     No Known Allergies  Family History  Problem Relation Age of Onset  . Hypertension Father   . Diabetes Sister   . Hypertension Sister   . Diabetes Maternal Grandmother   . Diabetes Maternal Grandfather   . Diabetes Paternal Grandmother   . Hypertension Paternal Grandmother   . Diabetes Paternal Grandfather   . Hypertension Paternal Grandfather     Social History   Socioeconomic History  . Marital status: Single    Spouse name: Not on file  . Number of  children: Not on file  . Years of education: Not on file  . Highest education level: Not on file  Occupational History  . Not on file  Social Needs  . Financial resource strain: Not on file  . Food insecurity    Worry: Not on file    Inability: Not on file  . Transportation needs    Medical: Not on file    Non-medical: Not on file  Tobacco Use  . Smoking status: Never Smoker  . Smokeless tobacco: Never Used  Substance and Sexual Activity  . Alcohol use: Yes    Comment: rare   . Drug use: No  . Sexual activity: Yes    Birth control/protection: None    Comment: approx [redacted] wks gestation  Lifestyle  . Physical activity    Days per week: Not on file    Minutes per session: Not on file  . Stress: Not on file  Relationships  . Social Herbalist on phone: Not on file    Gets together: Not on file    Attends religious service: Not on file    Active member of club or organization: Not on file    Attends meetings of clubs or organizations: Not on file  Relationship status: Not on file  . Intimate partner violence    Fear of current or ex partner: Not on file    Emotionally abused: Not on file    Physically abused: Not on file    Forced sexual activity: Not on file  Other Topics Concern  . Not on file  Social History Narrative  . Not on file     Constitutional: Denies fever, malaise, fatigue, headache or abrupt weight changes.  Respiratory: Denies difficulty breathing, shortness of breath, cough or sputum production.   Cardiovascular: Denies chest pain, chest tightness, palpitations or swelling in the hands or feet.    No other specific complaints in a complete review of systems (except as listed in HPI above).     Objective:   Physical Exam  BP (!) 134/94   Pulse 75   Temp (!) 97.1 F (36.2 C) (Temporal)   Wt 257 lb (116.6 kg)   SpO2 98%   BMI 39.66 kg/m   Wt Readings from Last 3 Encounters:  07/04/19 256 lb (116.1 kg)  03/28/19 246 lb (111.6 kg)   11/22/18 242 lb (109.8 kg)    General: Appears her stated age, obese, in NAD. Cardiovascular: Normal rate and rhythm. S1,S2 noted.  No murmur, rubs or gallops noted. No JVD or BLE edema. No carotid bruits noted. Pulmonary/Chest: Normal effort and positive vesicular breath sounds. No respiratory distress. No wheezes, rales or ronchi noted.     BMET    Component Value Date/Time   NA 139 07/04/2019 1254   K 4.4 07/04/2019 1254   CL 105 07/04/2019 1254   CO2 28 07/04/2019 1254   GLUCOSE 94 07/04/2019 1254   BUN 9 07/04/2019 1254   CREATININE 1.06 07/04/2019 1254   CREATININE 1.02 07/01/2018 1634   CALCIUM 9.5 07/04/2019 1254    Lipid Panel     Component Value Date/Time   CHOL 149 07/04/2019 1254   TRIG 87.0 07/04/2019 1254   HDL 30.90 (L) 07/04/2019 1254   CHOLHDL 5 07/04/2019 1254   VLDL 17.4 07/04/2019 1254   LDLCALC 101 (H) 07/04/2019 1254   LDLCALC 102 (H) 07/01/2018 1634    CBC    Component Value Date/Time   WBC 7.6 07/04/2019 1254   RBC 5.46 (H) 07/04/2019 1254   HGB 13.6 07/04/2019 1254   HCT 42.5 07/04/2019 1254   PLT 295.0 07/04/2019 1254   MCV 77.9 (L) 07/04/2019 1254   MCH 28.2 07/01/2018 1634   MCHC 31.9 07/04/2019 1254   RDW 16.5 (H) 07/04/2019 1254   LYMPHSABS 1.7 01/24/2015 1104   MONOABS 0.5 01/24/2015 1104   EOSABS 0.2 01/24/2015 1104   BASOSABS 0.0 01/24/2015 1104    Hgb A1C Lab Results  Component Value Date   HGBA1C 5.9 07/04/2019         Assessment & Plan:       This visit occurred during the SARS-CoV-2 public health emergency.  Safety protocols were in place, including screening questions prior to the visit, additional usage of staff PPE, and extensive cleaning of exam room while observing appropriate contact time as indicated for disinfecting solutions.

## 2019-08-09 NOTE — Patient Instructions (Signed)

## 2019-08-09 NOTE — Assessment & Plan Note (Addendum)
Will increase Amlodipine to 10 mg Check BP at home at least 2 hrs after taking med X 2 weeks Reinforced DASH diet and exercise for weight loss

## 2019-09-01 ENCOUNTER — Ambulatory Visit: Payer: Managed Care, Other (non HMO) | Attending: Internal Medicine

## 2019-09-01 DIAGNOSIS — Z20822 Contact with and (suspected) exposure to covid-19: Secondary | ICD-10-CM

## 2019-09-02 LAB — NOVEL CORONAVIRUS, NAA: SARS-CoV-2, NAA: NOT DETECTED

## 2019-09-27 ENCOUNTER — Other Ambulatory Visit: Payer: Self-pay | Admitting: Internal Medicine

## 2019-10-26 ENCOUNTER — Other Ambulatory Visit: Payer: Self-pay | Admitting: Internal Medicine

## 2019-11-10 IMAGING — DX DG HIP (WITH OR WITHOUT PELVIS) 2-3V*L*
2 series · 2 of 2 positions shown · non-contrast
Comparison: None.

CLINICAL DATA: Bilateral hip pain radiating into the groin. No
known injury.

EXAM:
DG HIP (WITH OR WITHOUT PELVIS) 2-3V RIGHT; DG HIP (WITH OR WITHOUT
PELVIS) 2-3V LEFT

[hip ap]
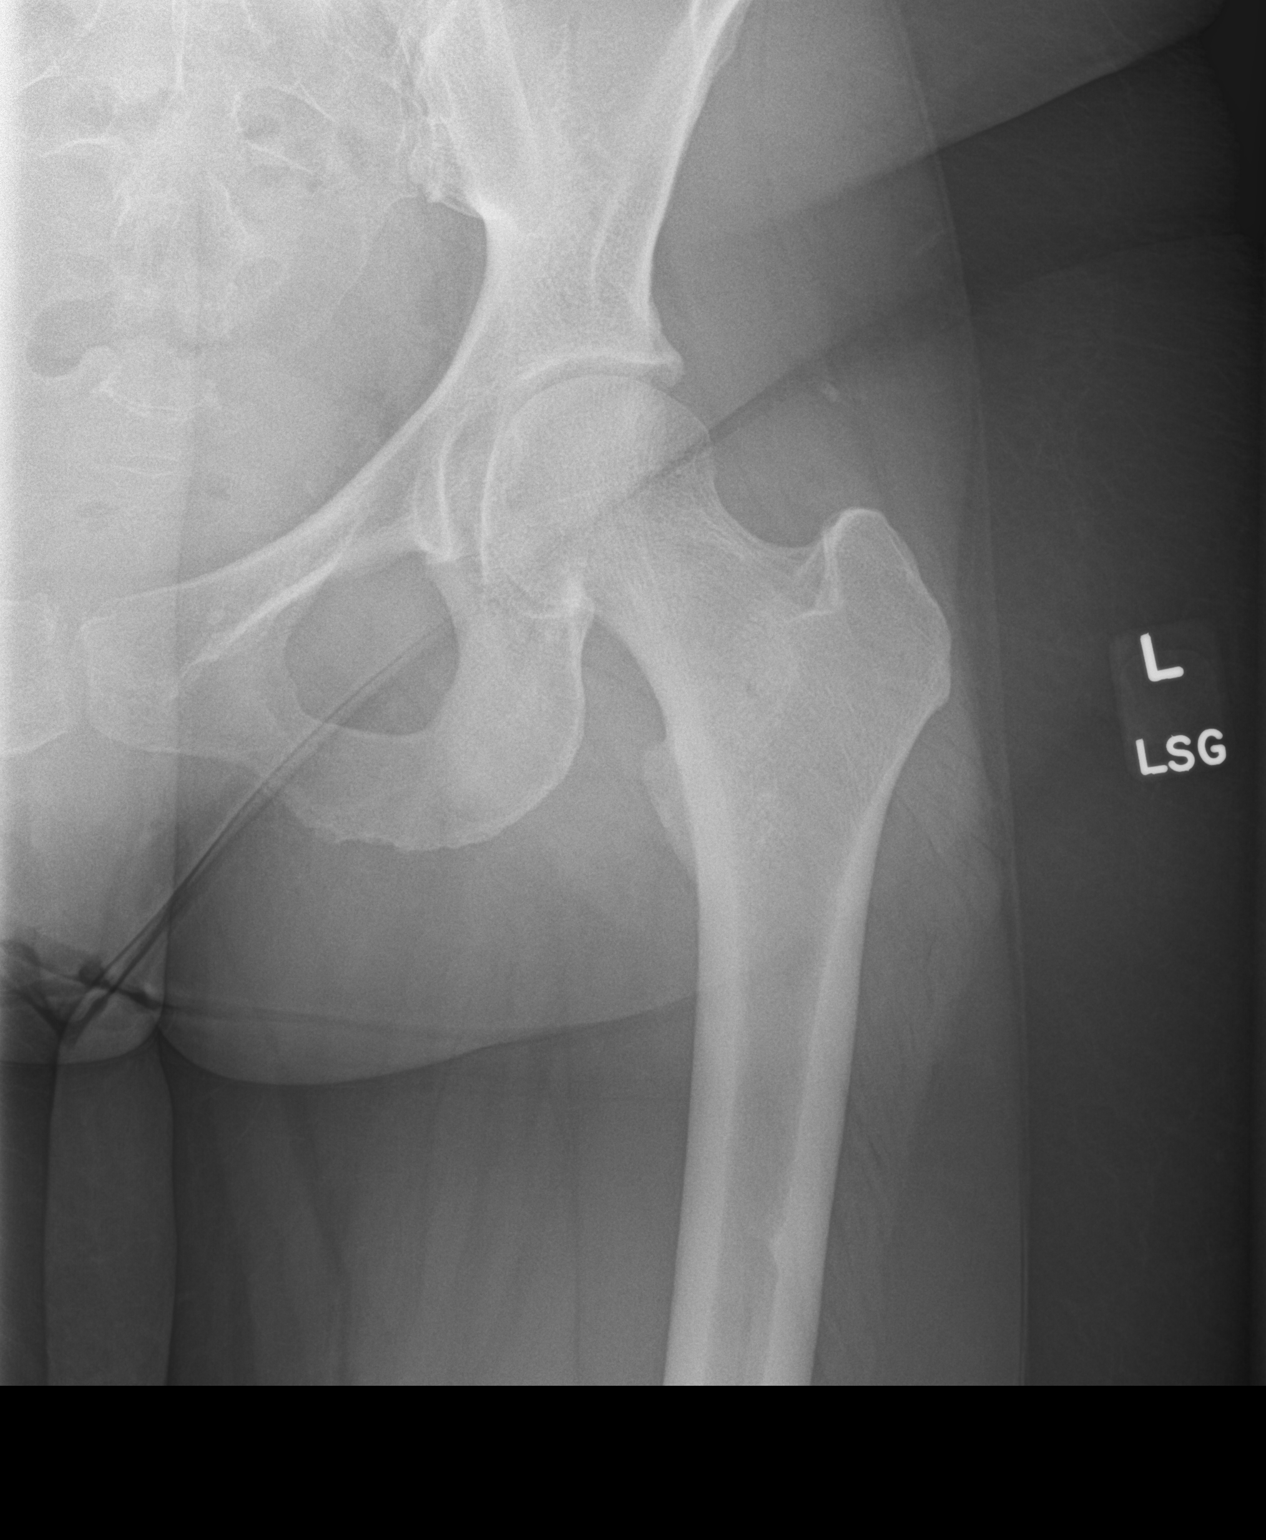

[hip lat]
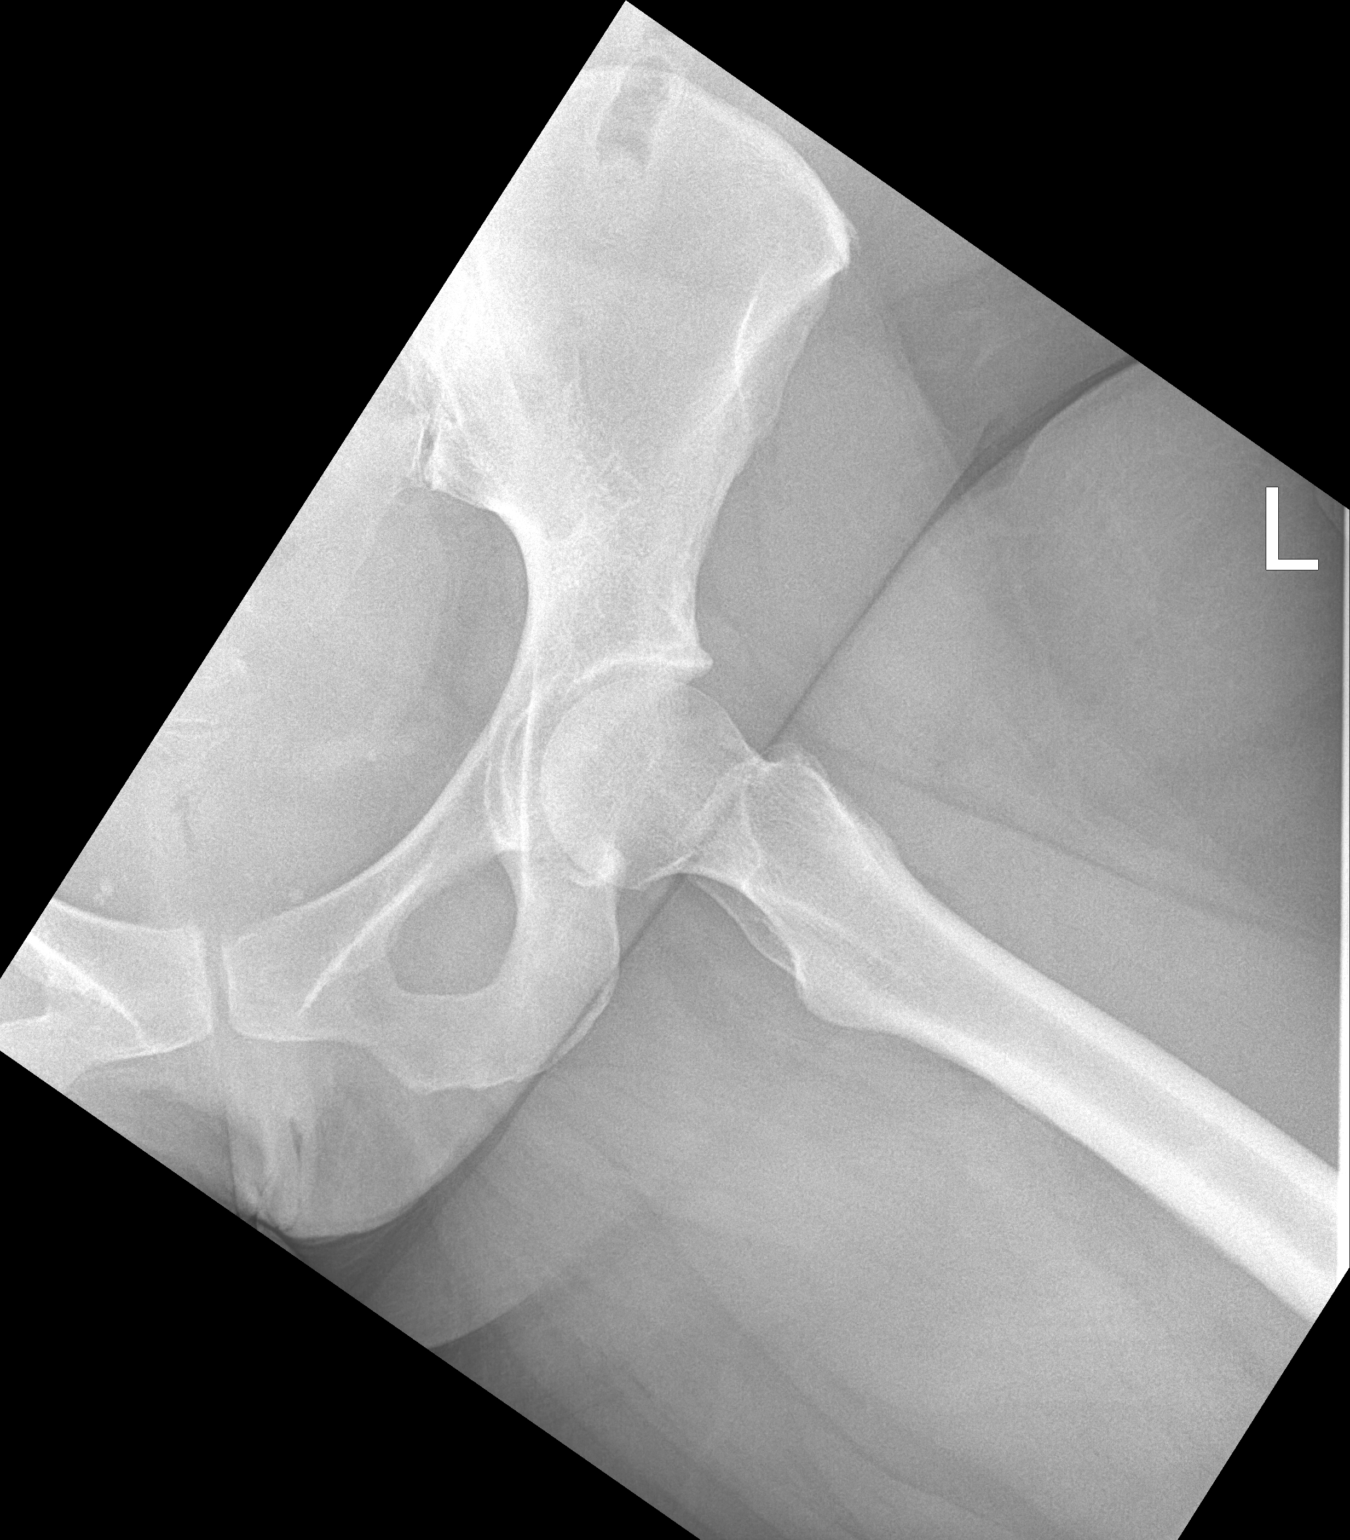

[2 of 2 positions shown; findings below may reference images not displayed]

FINDINGS: There is no evidence of hip fracture or dislocation. There is no
evidence of arthropathy or other focal bone abnormality.
IMPRESSION: Negative exam.

## 2020-01-04 ENCOUNTER — Ambulatory Visit: Payer: Managed Care, Other (non HMO) | Admitting: Internal Medicine

## 2020-01-04 ENCOUNTER — Encounter: Payer: Self-pay | Admitting: Internal Medicine

## 2020-01-04 ENCOUNTER — Ambulatory Visit (INDEPENDENT_AMBULATORY_CARE_PROVIDER_SITE_OTHER)
Admission: RE | Admit: 2020-01-04 | Discharge: 2020-01-04 | Disposition: A | Discharge status: Home / Self Care | Payer: Managed Care, Other (non HMO) | Source: Ambulatory Visit | Attending: Internal Medicine | Admitting: Internal Medicine

## 2020-01-04 ENCOUNTER — Other Ambulatory Visit: Payer: Self-pay

## 2020-01-04 VITALS — BP 136/90 | HR 77 | Temp 97.8°F | Wt 266.0 lb

## 2020-01-04 DIAGNOSIS — M545 Low back pain, unspecified: Secondary | ICD-10-CM

## 2020-01-04 DIAGNOSIS — R5383 Other fatigue: Secondary | ICD-10-CM | POA: Diagnosis not present

## 2020-01-04 DIAGNOSIS — G8929 Other chronic pain: Secondary | ICD-10-CM | POA: Diagnosis not present

## 2020-01-04 DIAGNOSIS — I10 Essential (primary) hypertension: Secondary | ICD-10-CM

## 2020-01-04 LAB — CBC
HCT: 42 % (ref 36.0–46.0)
Hemoglobin: 13.9 g/dL (ref 12.0–15.0)
MCHC: 33 g/dL (ref 30.0–36.0)
MCV: 84.3 fl (ref 78.0–100.0)
Platelets: 273 10*3/uL (ref 150.0–400.0)
RBC: 4.98 Mil/uL (ref 3.87–5.11)
RDW: 14.4 % (ref 11.5–15.5)
WBC: 6.9 10*3/uL (ref 4.0–10.5)

## 2020-01-04 LAB — VITAMIN D 25 HYDROXY (VIT D DEFICIENCY, FRACTURES): VITD: 33.73 ng/mL (ref 30.00–100.00)

## 2020-01-04 LAB — VITAMIN B12: Vitamin B-12: 353 pg/mL (ref 211–911)

## 2020-01-04 LAB — TSH: TSH: 0.88 u[IU]/mL (ref 0.35–4.50)

## 2020-01-04 MED ORDER — METHOCARBAMOL 500 MG PO TABS: 500.0000 mg | ORAL_TABLET | Freq: Every evening | ORAL | 0 refills | Status: DC | PRN

## 2020-01-04 NOTE — Patient Instructions (Signed)

## 2020-01-04 NOTE — Progress Notes (Signed)
Subjective:    Patient ID: Jo Goodwin, female    DOB: 1976-01-18, 44 y.o.   MRN: [id number]  HPI  Pt presents to the clinic today with complaint of elevated blood pressure.  She reports her blood pressure at home typically runs 130/90.  She was at [facility] for follow-up from her recent hysterectomy, and her blood pressure was 151 years.  95.  She is taking Amlodipine as prescribed.  She denies headaches, dizziness or visual changes.  He has steadily gained weight over the last 2 years.  She also reports bilateral low back pain.  This has been going on for months/years.  She describes the pain as achy.  The pain does not radiate.  She denies numbness, tingling or weakness of the lower extremities.  She denies issues with bowel or bladder control.  GYN thought her back pain could be related to her fibroids but this did not improve after hysterectomy, actually worsened.  She has taken ibuprofen and Tylenol with some relief.  No swelling noted extreme fatigue after her hysterectomy.  She is concerned that her blood counts or vitamin levels may be low.  Review of Systems  Past Medical History:  Diagnosis Date  . Anemia   . Fibroids   . Frequent headaches    otc med prn  . GERD (gastroesophageal reflux disease)    diet controlled, no meds  . SVD (spontaneous vaginal delivery)    x 1    Current Outpatient Medications  Medication Sig Dispense Refill  . acetaminophen (TYLENOL) 500 MG tablet Take 1,000 mg by mouth every 6 (six) hours as needed for headache.    Marland Kitchen amLODipine (NORVASC) 10 MG tablet TAKE 1 TABLET BY MOUTH EVERY DAY 30 tablet 2  . Cholecalciferol (VITAMIN D) 2000 units CAPS Take 1 capsule by mouth daily.    . ferrous sulfate 325 (65 FE) MG tablet Take 325 mg by mouth daily with breakfast.    . megestrol (MEGACE) 40 MG tablet Take 40 mg by mouth daily.    . Multiple Vitamin (MULTIVITAMIN) tablet Take 1 tablet by mouth daily.    . Vitamin D, Ergocalciferol, (DRISDOL) 1.25  MG (50000 UNIT) CAPS capsule TAKE 1 CAPSULE (50,000 UNITS TOTAL) BY MOUTH EVERY 7 (SEVEN) DAYS. 4 capsule 0   No current facility-administered medications for this visit.    No Known Allergies  Family History  Problem Relation Age of Onset  . Hypertension Father   . Diabetes Sister   . Hypertension Sister   . Diabetes Maternal Grandmother   . Diabetes Maternal Grandfather   . Diabetes Paternal Grandmother   . Hypertension Paternal Grandmother   . Diabetes Paternal Grandfather   . Hypertension Paternal Grandfather     Social History   Socioeconomic History  . Marital status: Single    Spouse name: Not on file  . Number of children: Not on file  . Years of education: Not on file  . Highest education level: Not on file  Occupational History  . Not on file  Tobacco Use  . Smoking status: Never Smoker  . Smokeless tobacco: Never Used  Substance and Sexual Activity  . Alcohol use: Yes    Comment: rare   . Drug use: No  . Sexual activity: Yes    Birth control/protection: None    Comment: approx [redacted] wks gestation  Other Topics Concern  . Not on file  Social History Narrative  . Not on file   Social Determinants of  Health   Financial Resource Strain:   . Difficulty of Paying Living Expenses:   Food Insecurity:   . Worried About Charity fundraiser in the Last Year:   . Arboriculturist in the Last Year:   Transportation Needs:   . Film/video editor (Medical):   Marland Kitchen Lack of Transportation (Non-Medical):   Physical Activity:   . Days of Exercise per Week:   . Minutes of Exercise per Session:   Stress:   . Feeling of Stress :   Social Connections:   . Frequency of Communication with Friends and Family:   . Frequency of Social Gatherings with Friends and Family:   . Attends Religious Services:   . Active Member of Clubs or Organizations:   . Attends Archivist Meetings:   Marland Kitchen Marital Status:   Intimate Partner Violence:   . Fear of Current or Ex-Partner:    . Emotionally Abused:   Marland Kitchen Physically Abused:   . Sexually Abused:      Constitutional: Patient reports fatigue.  Denies fever, malaise, headache or abrupt weight changes.  Respiratory: Denies difficulty breathing, shortness of breath, cough or sputum production.   Cardiovascular: Denies chest pain, chest tightness, palpitations or swelling in the hands or feet.  Gastrointestinal: Denies abdominal pain, bloating, constipation, diarrhea or blood in the stool.  GU: Denies urgency, frequency, pain with urination, burning sensation, blood in urine, odor or discharge. Musculoskeletal: Patient bilateral low back pain.  Denies decrease in range of motion, difficulty with gait, or joint pain and swelling.  Skin: Denies redness, rashes, lesions or ulcercations.  Neurological: Denies dizziness, difficulty with memory, difficulty with speech or problems with balance and coordination.    No other specific complaints in a complete review of systems (except as listed in HPI above).     Objective:   Physical Exam   BP 136/90   Pulse 77   Temp 97.8 F (36.6 C) (Temporal)   Wt 266 lb (120.7 kg)   LMP 11/02/2018 (Exact Date)   SpO2 98%   BMI 41.05 kg/m   Wt Readings from Last 3 Encounters:  08/09/19 257 lb (116.6 kg)  07/04/19 256 lb (116.1 kg)  03/28/19 246 lb (111.6 kg)    General: Appears her stated age, obese, in NAD. Neck:  Neck supple, trachea midline. No masses, lumps or thyromegaly present.  Cardiovascular: Normal rate and rhythm. S1,S2 noted.  No murmur, rubs or gallops noted. No JVD or BLE edema.  Pulmonary/Chest: Normal effort and positive vesicular breath sounds. No respiratory distress. No wheezes, rales or ronchi noted.  Musculoskeletal: Normal flexion, extension, rotation and lateral bending of the lumbar spine.  No bony tenderness noted over the lumbar spine pain with palpation of bilateral paralumbar muscles.  Strength 5/5 BLE.  No difficulty with gait.  Neurological:  Alert and oriented.   BMET    Component Value Date/Time   NA 139 07/04/2019 1254   K 4.4 07/04/2019 1254   CL 105 07/04/2019 1254   CO2 28 07/04/2019 1254   GLUCOSE 94 07/04/2019 1254   BUN 9 07/04/2019 1254   CREATININE 1.06 07/04/2019 1254   CREATININE 1.02 07/01/2018 1634   CALCIUM 9.5 07/04/2019 1254    Lipid Panel     Component Value Date/Time   CHOL 149 07/04/2019 1254   TRIG 87.0 07/04/2019 1254   HDL 30.90 (L) 07/04/2019 1254   CHOLHDL 5 07/04/2019 1254   VLDL 17.4 07/04/2019 1254   LDLCALC 101 (H)  07/04/2019 1254   LDLCALC 102 (H) 07/01/2018 1634    CBC    Component Value Date/Time   WBC 7.6 07/04/2019 1254   RBC 5.46 (H) 07/04/2019 1254   HGB 13.6 07/04/2019 1254   HCT 42.5 07/04/2019 1254   PLT 295.0 07/04/2019 1254   MCV 77.9 (L) 07/04/2019 1254   MCH 28.2 07/01/2018 1634   MCHC 31.9 07/04/2019 1254   RDW 16.5 (H) 07/04/2019 1254   LYMPHSABS 1.7 01/24/2015 1104   MONOABS 0.5 01/24/2015 1104   EOSABS 0.2 01/24/2015 1104   BASOSABS 0.0 01/24/2015 1104    Hgb A1C Lab Results  Component Value Date   HGBA1C 5.9 07/04/2019           Assessment & Plan:  Fatigue:  We will check CBC, TSH, vitamin D and B12.  Bilateral Low Back Pain:  X-ray lumbar spine today Encouraged core strengthening and weight loss Continue ibuprofen OTC Rx for Robaxin follow-up nightly as needed-sedation caution given  Hypertension:  Encouraged DASH diet and exercise for weight loss Continue Amlodipine We will monitor  Will follow up after labs and xray. Return precautions discussed Webb Silversmith, NP This visit occurred during the SARS-CoV-2 public health emergency.  Safety protocols were in place, including screening questions prior to the visit, additional usage of staff PPE, and extensive cleaning of exam room while observing appropriate contact time as indicated for disinfecting solutions.

## 2020-01-11 ENCOUNTER — Ambulatory Visit: Payer: Managed Care, Other (non HMO)

## 2020-01-13 ENCOUNTER — Ambulatory Visit: Payer: Managed Care, Other (non HMO) | Attending: Internal Medicine

## 2020-01-13 DIAGNOSIS — Z23 Encounter for immunization: Secondary | ICD-10-CM

## 2020-01-13 NOTE — Progress Notes (Signed)
   Covid-19 Vaccination Clinic  Name:  Jo Goodwin    MRN: [id number] DOB: Jan 18, 1976  01/13/2020  [name] was observed post Covid-19 immunization for 15 minutes without incident. She was provided with Vaccine Information Sheet and instruction to access the V-Safe system.   [name] was instructed to call 911 with any severe reactions post vaccine: Marland Kitchen Difficulty breathing  . Swelling of face and throat  . A fast heartbeat  . A bad rash all over body  . Dizziness and weakness   Immunizations Administered    Name Date Dose VIS Date Route   Pfizer COVID-19 Vaccine 01/13/2020 11:38 AM 0.3 mL 11/08/2018 Intramuscular   Manufacturer: [place]   Lot: [id number]   [place]: [id number]

## 2020-02-05 ENCOUNTER — Ambulatory Visit: Payer: Managed Care, Other (non HMO) | Attending: Internal Medicine

## 2020-02-05 DIAGNOSIS — Z23 Encounter for immunization: Secondary | ICD-10-CM

## 2020-02-05 NOTE — Progress Notes (Signed)
   Covid-19 Vaccination Clinic  Name:  Jo Goodwin    MRN: [id number] DOB: [birth year]  02/05/2020  [name] was observed post Covid-19 immunization for 15 minutes without incident. She was provided with Vaccine Information Sheet and instruction to access the V-Safe system.   [name] was instructed to call 911 with any severe reactions post vaccine: Marland Kitchen Difficulty breathing  . Swelling of face and throat  . A fast heartbeat  . A bad rash all over body  . Dizziness and weakness   Immunizations Administered    Name Date Dose VIS Date Route   Pfizer COVID-19 Vaccine 02/05/2020  8:44 AM 0.3 mL 11/08/2018 Intramuscular   Manufacturer: [place]   Lot: [id number]   [place]: [id number]

## 2020-02-14 ENCOUNTER — Other Ambulatory Visit: Payer: Self-pay | Admitting: Internal Medicine

## 2020-03-29 ENCOUNTER — Ambulatory Visit: Payer: Managed Care, Other (non HMO) | Admitting: Internal Medicine

## 2020-04-02 ENCOUNTER — Encounter: Payer: Self-pay | Admitting: Internal Medicine

## 2020-04-02 ENCOUNTER — Other Ambulatory Visit: Payer: Self-pay

## 2020-04-02 ENCOUNTER — Ambulatory Visit: Payer: Managed Care, Other (non HMO) | Admitting: Internal Medicine

## 2020-04-02 VITALS — BP 124/86 | HR 87 | Temp 97.5°F | Wt 254.0 lb

## 2020-04-02 DIAGNOSIS — G4719 Other hypersomnia: Secondary | ICD-10-CM

## 2020-04-02 DIAGNOSIS — R5383 Other fatigue: Secondary | ICD-10-CM | POA: Diagnosis not present

## 2020-04-02 DIAGNOSIS — I10 Essential (primary) hypertension: Secondary | ICD-10-CM

## 2020-04-02 DIAGNOSIS — R0683 Snoring: Secondary | ICD-10-CM

## 2020-04-02 LAB — CBC
HCT: 42.3 % (ref 36.0–46.0)
Hemoglobin: 14.2 g/dL (ref 12.0–15.0)
MCHC: 33.6 g/dL (ref 30.0–36.0)
MCV: 83.6 fl (ref 78.0–100.0)
Platelets: 249 10*3/uL (ref 150.0–400.0)
RBC: 5.06 Mil/uL (ref 3.87–5.11)
RDW: 13.8 % (ref 11.5–15.5)
WBC: 5.5 10*3/uL (ref 4.0–10.5)

## 2020-04-02 LAB — TSH: TSH: 0.66 u[IU]/mL (ref 0.35–4.50)

## 2020-04-02 LAB — VITAMIN D 25 HYDROXY (VIT D DEFICIENCY, FRACTURES): VITD: 26.61 ng/mL — ABNORMAL LOW (ref 30.00–100.00)

## 2020-04-02 LAB — VITAMIN B12: Vitamin B-12: 225 pg/mL (ref 211–911)

## 2020-04-02 NOTE — Assessment & Plan Note (Signed)
Improved D/C Amlodipine Will monitor

## 2020-04-02 NOTE — Patient Instructions (Signed)

## 2020-04-02 NOTE — Progress Notes (Signed)
Subjective:    Patient ID: Jo Goodwin, female    DOB: [birth year], 44 y.o.   MRN: [id number]  HPI  Patient presents the clinic today for follow-up of hypertension.  She stopped her Amlodipine due to persistent lower extremity swelling, which has improved.  Her BP today is 124/86.  ECG from 01/2015 reviewed.  She also reports ongoing fatigue since her hysterectomy in March. She reports she is not sleeping well at night. She does not wake up feeling rested. She does snore during the day. She does not nap during the day. She feels stressed due to work and has had intermittent anxiety and depression but feels like she manages this well. She denies s/s of bleeding.  Review of Systems  Past Medical History:  Diagnosis Date  . Anemia   . Fibroids   . Frequent headaches    otc med prn  . GERD (gastroesophageal reflux disease)    diet controlled, no meds  . SVD (spontaneous vaginal delivery)    x 1    Current Outpatient Medications  Medication Sig Dispense Refill  . acetaminophen (TYLENOL) 325 MG tablet Take 2 tabs every 6 hours for the first 3 days after surgery, then every 6 hours as needed for pain.    Marland Kitchen amLODipine (NORVASC) 10 MG tablet TAKE 1 TABLET BY MOUTH EVERY DAY 30 tablet 2  . Cholecalciferol (VITAMIN D) 2000 units CAPS Take 1 capsule by mouth daily.    . ferrous sulfate 325 (65 FE) MG tablet Take 325 mg by mouth daily with breakfast.    . ibuprofen (ADVIL) 600 MG tablet     . methocarbamol (ROBAXIN) 500 MG tablet Take 1 tablet (500 mg total) by mouth at bedtime as needed for muscle spasms. 20 tablet 0   No current facility-administered medications for this visit.    No Known Allergies  Family History  Problem Relation Age of Onset  . Hypertension Father   . Diabetes Sister   . Hypertension Sister   . Diabetes Maternal Grandmother   . Diabetes Maternal Grandfather   . Diabetes Paternal Grandmother   . Hypertension Paternal Grandmother   . Diabetes Paternal  Grandfather   . Hypertension Paternal Grandfather     Social History   Socioeconomic History  . Marital status: Single    Spouse name: Not on file  . Number of children: Not on file  . Years of education: Not on file  . Highest education level: Not on file  Occupational History  . Not on file  Tobacco Use  . Smoking status: Never Smoker  . Smokeless tobacco: Never Used  Substance and Sexual Activity  . Alcohol use: Yes    Comment: rare   . Drug use: No  . Sexual activity: Yes    Birth control/protection: None    Comment: approx [redacted] wks gestation  Other Topics Concern  . Not on file  Social History Narrative  . Not on file   Social Determinants of Health   Financial Resource Strain:   . Difficulty of Paying Living Expenses:   Food Insecurity:   . Worried About Charity fundraiser in the Last Year:   . Arboriculturist in the Last Year:   Transportation Needs:   . Film/video editor (Medical):   Marland Kitchen Lack of Transportation (Non-Medical):   Physical Activity:   . Days of Exercise per Week:   . Minutes of Exercise per Session:   Stress:   . Feeling  of Stress :   Social Connections:   . Frequency of Communication with Friends and Family:   . Frequency of Social Gatherings with Friends and Family:   . Attends Religious Services:   . Active Member of Clubs or Organizations:   . Attends Archivist Meetings:   Marland Kitchen Marital Status:   Intimate Partner Violence:   . Fear of Current or Ex-Partner:   . Emotionally Abused:   Marland Kitchen Physically Abused:   . Sexually Abused:      Constitutional: Pt reports fatigue. Denies fever, malaise, headache or abrupt weight changes.  Respiratory: Denies difficulty breathing, shortness of breath, cough or sputum production.   Cardiovascular: Denies chest pain, chest tightness, palpitations or swelling in the hands or feet.  Gastrointestinal: Denies abdominal pain, bloating, constipation, diarrhea or blood in the stool.  Neurological:  Denies dizziness, difficulty with memory, difficulty with speech or problems with balance and coordination.  Psych: Pt reports stress, intermittent anxiety and depression. Denies SI/HI.  No other specific complaints in a complete review of systems (except as listed in HPI above).     Objective:   Physical Exam BP 124/86   Pulse 87   Temp (!) 97.5 F (36.4 C) (Temporal)   Wt 254 lb (115.2 kg)   LMP 11/02/2018 (Exact Date)   SpO2 98%   BMI 39.19 kg/m    Wt Readings from Last 3 Encounters:  01/04/20 266 lb (120.7 kg)  08/09/19 257 lb (116.6 kg)  07/04/19 256 lb (116.1 kg)    General: Appears her stated age, obese,  in NAD. HEENT: Head: normal shape and size;  Neck:  Neck supple, trachea midline. No masses, lumps or thyromegaly present.  Cardiovascular: Normal rate and rhythm. S1,S2 noted.  No murmur, rubs or gallops noted. No JVD or BLE edema.  Pulmonary/Chest: Normal effort and positive vesicular breath sounds. No respiratory distress. No wheezes, rales or ronchi noted.  Musculoskeletal: . No difficulty with gait.  Neurological: Alert and oriented.  Psychiatric: Mood and affect normal. Behavior is normal. Judgment and thought content normal.    BMET    Component Value Date/Time   NA 139 07/04/2019 1254   K 4.4 07/04/2019 1254   CL 105 07/04/2019 1254   CO2 28 07/04/2019 1254   GLUCOSE 94 07/04/2019 1254   BUN 9 07/04/2019 1254   CREATININE 1.06 07/04/2019 1254   CREATININE 1.02 07/01/2018 1634   CALCIUM 9.5 07/04/2019 1254    Lipid Panel     Component Value Date/Time   CHOL 149 07/04/2019 1254   TRIG 87.0 07/04/2019 1254   HDL 30.90 (L) 07/04/2019 1254   CHOLHDL 5 07/04/2019 1254   VLDL 17.4 07/04/2019 1254   LDLCALC 101 (H) 07/04/2019 1254   LDLCALC 102 (H) 07/01/2018 1634    CBC    Component Value Date/Time   WBC 6.9 01/04/2020 1143   RBC 4.98 01/04/2020 1143   HGB 13.9 01/04/2020 1143   HCT 42.0 01/04/2020 1143   PLT 273.0 01/04/2020 1143   MCV  84.3 01/04/2020 1143   MCH 28.2 07/01/2018 1634   MCHC 33.0 01/04/2020 1143   RDW 14.4 01/04/2020 1143   LYMPHSABS 1.7 01/24/2015 1104   MONOABS 0.5 01/24/2015 1104   EOSABS 0.2 01/24/2015 1104   BASOSABS 0.0 01/24/2015 1104    Hgb A1C Lab Results  Component Value Date   HGBA1C 5.9 07/04/2019            Assessment & Plan:   Fatigue, Snoring, Excessive  Daytime Sleepiness:  Neck circumference 15.25 in ESS score of 11 Will check CBC, TSH, Vit D and B12 If labs normal, consider referral for sleep study  Will follow up after labs, return precautions discussed Webb Silversmith, NP This visit occurred during the SARS-CoV-2 public health emergency.  Safety protocols were in place, including screening questions prior to the visit, additional usage of staff PPE, and extensive cleaning of exam room while observing appropriate contact time as indicated for disinfecting solutions.

## 2020-06-05 ENCOUNTER — Telehealth: Payer: Self-pay | Admitting: Internal Medicine

## 2020-06-05 NOTE — Telephone Encounter (Signed)
Pt needs CPE prior to 07/05/2020, however, her last CPE was 07/04/2019 and you have nothing available for a CPE on the 21st or 22nd.  Please advise and call pt. Thank you

## 2020-06-05 NOTE — Telephone Encounter (Signed)
07/05/20 at 2 pm

## 2020-06-12 NOTE — Telephone Encounter (Signed)
Mel, can you find her a spot

## 2020-06-12 NOTE — Telephone Encounter (Signed)
According to patient her insurance is from July to June, so her insurance will cover the visit.

## 2020-06-12 NOTE — Telephone Encounter (Signed)
Her insurance won't pay for it prior to 10/21 correct?

## 2020-06-12 NOTE — Telephone Encounter (Signed)
Patient called.  Patient said her Anniversary is 07/05/20 and she was leaving to go out of town on 07/04/20.  Patient said her insurance runs from July to June, so she can come anytime from now until 07/03/20. Is there a time she can come for her cpx from now until 07/03/20?

## 2020-06-17 NOTE — Telephone Encounter (Signed)
Pt scheduled for Oct 14 at 2pm, pt is aware

## 2020-06-27 ENCOUNTER — Ambulatory Visit (INDEPENDENT_AMBULATORY_CARE_PROVIDER_SITE_OTHER): Payer: Managed Care, Other (non HMO) | Admitting: Internal Medicine

## 2020-06-27 ENCOUNTER — Other Ambulatory Visit: Payer: Self-pay

## 2020-06-27 ENCOUNTER — Encounter: Payer: Self-pay | Admitting: Internal Medicine

## 2020-06-27 VITALS — BP 124/84 | HR 60 | Temp 97.5°F | Ht 67.5 in | Wt 254.0 lb

## 2020-06-27 DIAGNOSIS — I158 Other secondary hypertension: Secondary | ICD-10-CM

## 2020-06-27 DIAGNOSIS — Z1211 Encounter for screening for malignant neoplasm of colon: Secondary | ICD-10-CM | POA: Diagnosis not present

## 2020-06-27 DIAGNOSIS — E010 Iodine-deficiency related diffuse (endemic) goiter: Secondary | ICD-10-CM | POA: Diagnosis not present

## 2020-06-27 DIAGNOSIS — R519 Headache, unspecified: Secondary | ICD-10-CM | POA: Diagnosis not present

## 2020-06-27 DIAGNOSIS — Z0000 Encounter for general adult medical examination without abnormal findings: Secondary | ICD-10-CM

## 2020-06-27 DIAGNOSIS — D500 Iron deficiency anemia secondary to blood loss (chronic): Secondary | ICD-10-CM | POA: Diagnosis not present

## 2020-06-27 DIAGNOSIS — K219 Gastro-esophageal reflux disease without esophagitis: Secondary | ICD-10-CM

## 2020-06-27 LAB — TSH: TSH: 0.99 u[IU]/mL (ref 0.35–4.50)

## 2020-06-27 LAB — LIPID PANEL
Cholesterol: 150 mg/dL (ref 0–200)
HDL: 34.6 mg/dL — ABNORMAL LOW (ref >39.00)
LDL Cholesterol: 99 mg/dL (ref 0–99)
NonHDL: 115.57
Total CHOL/HDL Ratio: 4
Triglycerides: 81 mg/dL (ref 0.0–149.0)
VLDL: 16.2 mg/dL (ref 0.0–40.0)

## 2020-06-27 LAB — COMPREHENSIVE METABOLIC PANEL
ALT: 14 U/L (ref 0–35)
AST: 15 U/L (ref 0–37)
Albumin: 4 g/dL (ref 3.5–5.2)
Alkaline Phosphatase: 86 U/L (ref 39–117)
BUN: 11 mg/dL (ref 6–23)
CO2: 30 mEq/L (ref 19–32)
Calcium: 9 mg/dL (ref 8.4–10.5)
Chloride: 103 mEq/L (ref 96–112)
Creatinine, Ser: 0.82 mg/dL (ref 0.40–1.20)
GFR: 86.74 mL/min (ref >60.00)
Glucose, Bld: 80 mg/dL (ref 70–99)
Potassium: 4.5 mEq/L (ref 3.5–5.1)
Sodium: 138 mEq/L (ref 135–145)
Total Bilirubin: 0.4 mg/dL (ref 0.2–1.2)
Total Protein: 7 g/dL (ref 6.0–8.3)

## 2020-06-27 LAB — CBC
HCT: 41.5 % (ref 36.0–46.0)
Hemoglobin: 13.8 g/dL (ref 12.0–15.0)
MCHC: 33.3 g/dL (ref 30.0–36.0)
MCV: 85.5 fl (ref 78.0–100.0)
Platelets: 258 10*3/uL (ref 150.0–400.0)
RBC: 4.85 Mil/uL (ref 3.87–5.11)
RDW: 13.5 % (ref 11.5–15.5)
WBC: 7.7 10*3/uL (ref 4.0–10.5)

## 2020-06-27 LAB — VITAMIN D 25 HYDROXY (VIT D DEFICIENCY, FRACTURES): VITD: 29.41 ng/mL — ABNORMAL LOW (ref 30.00–100.00)

## 2020-06-27 LAB — HEMOGLOBIN A1C: Hgb A1c MFr Bld: 5.4 % (ref 4.6–6.5)

## 2020-06-27 NOTE — Progress Notes (Signed)
Subjective:    Patient ID: Jo Goodwin, female    DOB: 09-12-1976, 44 y.o.   MRN: [id number]  HPI  Pt presents to the clinic today for her annual exam. She is also due to follow up chronic conditions.  Anemia secondary to Chronic Blood Loss: Resolved after hysterectomy. She is no longer taking oral iron.   GERD: Intermittent.  She takes Tums as needed with good relief of symptoms. There is no upper GI on file.  Frequent Headaches: These have not been as bad or as frequent. Triggered by stress. She takes Tylenol or Excedrin Migraine as needed with good relief of symptoms.  HTN: Her BP today is 124/84. She is not taking any antihypertensive therapy at this time.  Flu: never Tetanus: 06/2018 Covid: Pfizer Pap Smear: 09/2018 Mammogram: 2020, [facility] Screening: annually Dentist: annually  Diet: She does eat meat. She consumes fruits and veggies daily. She does eat some fried foods. She drinks mostly water, coffee, unsweet tea. Exercise: None  Review of Systems      Past Medical History:  Diagnosis Date  . Anemia   . Fibroids   . Frequent headaches    otc med prn  . GERD (gastroesophageal reflux disease)    diet controlled, no meds  . SVD (spontaneous vaginal delivery)    x 1    Current Outpatient Medications  Medication Sig Dispense Refill  . acetaminophen (TYLENOL) 325 MG tablet Take 2 tabs every 6 hours for the first 3 days after surgery, then every 6 hours as needed for pain.    . Cholecalciferol (VITAMIN D) 2000 units CAPS Take 1 capsule by mouth daily.    . ferrous sulfate 325 (65 FE) MG tablet Take 325 mg by mouth daily with breakfast.    . ibuprofen (ADVIL) 600 MG tablet      No current facility-administered medications for this visit.    No Known Allergies  Family History  Problem Relation Age of Onset  . Hypertension Father   . Diabetes Sister   . Hypertension Sister   . Diabetes Maternal Grandmother   . Diabetes Maternal Grandfather   .  Diabetes Paternal Grandmother   . Hypertension Paternal Grandmother   . Diabetes Paternal Grandfather   . Hypertension Paternal Grandfather     Social History   Socioeconomic History  . Marital status: Single    Spouse name: Not on file  . Number of children: Not on file  . Years of education: Not on file  . Highest education level: Not on file  Occupational History  . Not on file  Tobacco Use  . Smoking status: Never Smoker  . Smokeless tobacco: Never Used  Substance and Sexual Activity  . Alcohol use: Yes    Comment: rare   . Drug use: No  . Sexual activity: Yes    Birth control/protection: None    Comment: approx [redacted] wks gestation  Other Topics Concern  . Not on file  Social History Narrative  . Not on file   Social Determinants of Health   Financial Resource Strain:   . Difficulty of Paying Living Expenses: Not on file  Food Insecurity:   . Worried About Charity fundraiser in the Last Year: Not on file  . Ran Out of Food in the Last Year: Not on file  Transportation Needs:   . Lack of Transportation (Medical): Not on file  . Lack of Transportation (Non-Medical): Not on file  Physical Activity:   .  Days of Exercise per Week: Not on file  . Minutes of Exercise per Session: Not on file  Stress:   . Feeling of Stress : Not on file  Social Connections:   . Frequency of Communication with Friends and Family: Not on file  . Frequency of Social Gatherings with Friends and Family: Not on file  . Attends Religious Services: Not on file  . Active Member of Clubs or Organizations: Not on file  . Attends Archivist Meetings: Not on file  . Marital Status: Not on file  Intimate Partner Violence:   . Fear of Current or Ex-Partner: Not on file  . Emotionally Abused: Not on file  . Physically Abused: Not on file  . Sexually Abused: Not on file     Constitutional: Pt reports intermittent headaches. Denies fever, malaise, fatigue, or abrupt weight changes.    HEENT: Denies eye pain, eye redness, ear pain, ringing in the ears, wax buildup, runny nose, nasal congestion, bloody nose, or sore throat. Respiratory: Denies difficulty breathing, shortness of breath, cough or sputum production.   Cardiovascular: Denies chest pain, chest tightness, palpitations or swelling in the hands or feet.  Gastrointestinal: Denies abdominal pain, bloating, constipation, diarrhea or blood in the stool.  GU: Denies urgency, frequency, pain with urination, burning sensation, blood in urine, odor or discharge. Musculoskeletal: Pt reports left hip pain. Denies decrease in range of motion, difficulty with gait, muscle pain or joint swelling.  Skin: Denies redness, rashes, lesions or ulcercations.  Neurological: Denies dizziness, difficulty with memory, difficulty with speech or problems with balance and coordination.  Psych: Denies anxiety, depression, SI/HI.  No other specific complaints in a complete review of systems (except as listed in HPI above).  Objective:   Physical Exam   BP 124/84   Pulse 60   Temp (!) 97.5 F (36.4 C) (Temporal)   Ht 5' 7.5" (1.715 m)   Wt 254 lb (115.2 kg)   LMP 11/02/2018 (Exact Date)   SpO2 98%   BMI 39.19 kg/m   Wt Readings from Last 3 Encounters:  04/02/20 254 lb (115.2 kg)  01/04/20 266 lb (120.7 kg)  08/09/19 257 lb (116.6 kg)    General: Appears her stated age, obese, in NAD. Skin: Warm, dry and intact. No rashes, lesions or ulcerations noted. HEENT: Head: normal shape and size;  Neck:  Neck supple, trachea midline. No masses, lumps present. Thyromegaly noted. Cardiovascular: Normal rate and rhythm. S1,S2 noted.  No murmur, rubs or gallops noted. No JVD or BLE edema.  Pulmonary/Chest: Normal effort and positive vesicular breath sounds. No respiratory distress. No wheezes, rales or ronchi noted.  Abdomen: Soft and nontender. Normal bowel sounds. No distention or masses noted. Liver, spleen and kidneys non  palpable. Musculoskeletal: Strength 5/5 BUE/BLE. No difficulty with gait.  Neurological: Alert and oriented. Cranial nerves II-XII grossly intact. Coordination normal.  Psychiatric: Mood and affect normal. Behavior is normal. Judgment and thought content normal.   BMET    Component Value Date/Time   NA 139 07/04/2019 1254   K 4.4 07/04/2019 1254   CL 105 07/04/2019 1254   CO2 28 07/04/2019 1254   GLUCOSE 94 07/04/2019 1254   BUN 9 07/04/2019 1254   CREATININE 1.06 07/04/2019 1254   CREATININE 1.02 07/01/2018 1634   CALCIUM 9.5 07/04/2019 1254    Lipid Panel     Component Value Date/Time   CHOL 149 07/04/2019 1254   TRIG 87.0 07/04/2019 1254   HDL 30.90 (L) 07/04/2019  1254   CHOLHDL 5 07/04/2019 1254   VLDL 17.4 07/04/2019 1254   LDLCALC 101 (H) 07/04/2019 1254   LDLCALC 102 (H) 07/01/2018 1634    CBC    Component Value Date/Time   WBC 5.5 04/02/2020 1035   RBC 5.06 04/02/2020 1035   HGB 14.2 04/02/2020 1035   HCT 42.3 04/02/2020 1035   PLT 249.0 04/02/2020 1035   MCV 83.6 04/02/2020 1035   MCH 28.2 07/01/2018 1634   MCHC 33.6 04/02/2020 1035   RDW 13.8 04/02/2020 1035   LYMPHSABS 1.7 01/24/2015 1104   MONOABS 0.5 01/24/2015 1104   EOSABS 0.2 01/24/2015 1104   BASOSABS 0.0 01/24/2015 1104    Hgb A1C Lab Results  Component Value Date   HGBA1C 5.9 07/04/2019          Assessment & Plan:   Preventative Health Maintenance:  She declines flu shot today Tetanus UTD Covid UTD She no longer needs pap smears She will call to schedule her mammogram Referral to GI from screening colonoscopy Encouraged her to consume a balanced diet and exercise regimen Advised her to see an eye doctor and dentist annually Will check CBC, CMET, Lipid, TSH and A1C today  Thyromegaly:  TSH today  RTC in 1 year, sooner if needed  Webb Silversmith, NP This visit occurred during the SARS-CoV-2 public health emergency.  Safety protocols were in place, including screening  questions prior to the visit, additional usage of staff PPE, and extensive cleaning of exam room while observing appropriate contact time as indicated for disinfecting solutions.

## 2020-06-27 NOTE — Patient Instructions (Signed)
Health Maintenance, Female Adopting a healthy lifestyle and getting preventive care are important in promoting health and wellness. Ask your health care provider about:  The right schedule for you to have regular tests and exams.  Things you can do on your own to prevent diseases and keep yourself healthy. What should I know about diet, weight, and exercise? Eat a healthy diet   Eat a diet that includes plenty of vegetables, fruits, low-fat dairy products, and lean protein.  Do not eat a lot of foods that are high in solid fats, added sugars, or sodium. Maintain a healthy weight Body mass index (BMI) is used to identify weight problems. It estimates body fat based on height and weight. Your health care provider can help determine your BMI and help you achieve or maintain a healthy weight. Get regular exercise Get regular exercise. This is one of the most important things you can do for your health. Most adults should:  Exercise for at least 150 minutes each week. The exercise should increase your heart rate and make you sweat (moderate-intensity exercise).  Do strengthening exercises at least twice a week. This is in addition to the moderate-intensity exercise.  Spend less time sitting. Even light physical activity can be beneficial. Watch cholesterol and blood lipids Have your blood tested for lipids and cholesterol at 44 years of age, then have this test every 5 years. Have your cholesterol levels checked more often if:  Your lipid or cholesterol levels are high.  You are older than 44 years of age.  You are at high risk for heart disease. What should I know about cancer screening? Depending on your health history and family history, you may need to have cancer screening at various ages. This may include screening for:  Breast cancer.  Cervical cancer.  Colorectal cancer.  Skin cancer.  Lung cancer. What should I know about heart disease, diabetes, and high blood  pressure? Blood pressure and heart disease  High blood pressure causes heart disease and increases the risk of stroke. This is more likely to develop in people who have high blood pressure readings, are of African descent, or are overweight.  Have your blood pressure checked: ? Every 3-5 years if you are 18-39 years of age. ? Every year if you are 40 years old or older. Diabetes Have regular diabetes screenings. This checks your fasting blood sugar level. Have the screening done:  Once every three years after age 40 if you are at a normal weight and have a low risk for diabetes.  More often and at a younger age if you are overweight or have a high risk for diabetes. What should I know about preventing infection? Hepatitis B If you have a higher risk for hepatitis B, you should be screened for this virus. Talk with your health care provider to find out if you are at risk for hepatitis B infection. Hepatitis C Testing is recommended for:  Everyone born from 1945 through 1965.  Anyone with known risk factors for hepatitis C. Sexually transmitted infections (STIs)  Get screened for STIs, including gonorrhea and chlamydia, if: ? You are sexually active and are younger than 44 years of age. ? You are older than 44 years of age and your health care provider tells you that you are at risk for this type of infection. ? Your sexual activity has changed since you were last screened, and you are at increased risk for chlamydia or gonorrhea. Ask your health care provider if   you are at risk.  Ask your health care provider about whether you are at high risk for HIV. Your health care provider may recommend a prescription medicine to help prevent HIV infection. If you choose to take medicine to prevent HIV, you should first get tested for HIV. You should then be tested every 3 months for as long as you are taking the medicine. Pregnancy  If you are about to stop having your period (premenopausal) and  you may become pregnant, seek counseling before you get pregnant.  Take 400 to 800 micrograms (mcg) of folic acid every day if you become pregnant.  Ask for birth control (contraception) if you want to prevent pregnancy. Osteoporosis and menopause Osteoporosis is a disease in which the bones lose minerals and strength with aging. This can result in bone fractures. If you are 65 years old or older, or if you are at risk for osteoporosis and fractures, ask your health care provider if you should:  Be screened for bone loss.  Take a calcium or vitamin D supplement to lower your risk of fractures.  Be given hormone replacement therapy (HRT) to treat symptoms of menopause. Follow these instructions at home: Lifestyle  Do not use any products that contain nicotine or tobacco, such as cigarettes, e-cigarettes, and chewing tobacco. If you need help quitting, ask your health care provider.  Do not use street drugs.  Do not share needles.  Ask your health care provider for help if you need support or information about quitting drugs. Alcohol use  Do not drink alcohol if: ? Your health care provider tells you not to drink. ? You are pregnant, may be pregnant, or are planning to become pregnant.  If you drink alcohol: ? Limit how much you use to 0-1 drink a day. ? Limit intake if you are breastfeeding.  Be aware of how much alcohol is in your drink. In the [place] one drink equals one 12 oz bottle of beer (355 mL), one 5 oz glass of wine (148 mL), or one 1 oz glass of hard liquor (44 mL). General instructions  Schedule regular health, dental, and eye exams.  Stay current with your vaccines.  Tell your health care provider if: ? You often feel depressed. ? You have ever been abused or do not feel safe at home. Summary  Adopting a healthy lifestyle and getting preventive care are important in promoting health and wellness.  Follow your health care provider's instructions about healthy  diet, exercising, and getting tested or screened for diseases.  Follow your health care provider's instructions on monitoring your cholesterol and blood pressure. This information is not intended to replace advice given to you by your health care provider. Make sure you discuss any questions you have with your health care provider. Document Revised: 08/24/2018 Document Reviewed: 08/24/2018 Elsevier Patient Education  2020 [employer]vier Inc[employer]  

## 2020-06-27 NOTE — Assessment & Plan Note (Signed)
Stable off meds °Will monitor °

## 2020-06-27 NOTE — Assessment & Plan Note (Signed)
Avoid foods that trigger your reflux Continue Tums prn CBC and CMET today

## 2020-06-27 NOTE — Assessment & Plan Note (Signed)
Reduce stress when able Continue Tylenol or Excedrin as needed

## 2020-06-27 NOTE — Assessment & Plan Note (Signed)
Resolved after hysterectomy CBC today

## 2020-07-03 ENCOUNTER — Telehealth: Payer: Self-pay | Admitting: Internal Medicine

## 2020-07-03 NOTE — Telephone Encounter (Signed)
Patient called in stating she is wanting print out of labs and placed in front for pick up. Please advise.

## 2020-07-04 ENCOUNTER — Encounter: Payer: Self-pay | Admitting: Internal Medicine

## 2020-07-05 ENCOUNTER — Encounter: Payer: Managed Care, Other (non HMO) | Admitting: Internal Medicine

## 2020-07-15 ENCOUNTER — Other Ambulatory Visit: Payer: Self-pay

## 2020-07-15 ENCOUNTER — Telehealth (INDEPENDENT_AMBULATORY_CARE_PROVIDER_SITE_OTHER): Payer: Self-pay | Admitting: Gastroenterology

## 2020-07-15 DIAGNOSIS — Z1211 Encounter for screening for malignant neoplasm of colon: Secondary | ICD-10-CM

## 2020-07-15 MED ORDER — PEG 3350-KCL-NA BICARB-NACL 420 G PO SOLR: 4000.0000 mL | Freq: Once | ORAL | 0 refills | Status: AC

## 2020-07-15 NOTE — Progress Notes (Signed)
Gastroenterology Pre-Procedure Review  Request Date: Friday 08/02/20 Requesting Physician: Dr. Vicente Males  PATIENT REVIEW QUESTIONS: The patient responded to the following health history questions as indicated:    1. Are you having any GI issues? Change in bowel habits since hysterectomy 11/22/19 2. Do you have a personal history of Polyps? no 3. Do you have a family history of Colon Cancer or Polyps? yes (grandfather colon polyps) 4. Diabetes Mellitus? no 5. Joint replacements in the past 12 months?no 6. Major health problems in the past 3 months?yes (hysterectomy 11/22/2119) 7. Any artificial heart valves, MVP, or defibrillator?no    MEDICATIONS & ALLERGIES:    Patient reports the following regarding taking any anticoagulation/antiplatelet therapy:   Plavix, Coumadin, Eliquis, Xarelto, Lovenox, Pradaxa, Brilinta, or Effient? no Aspirin? no  Patient confirms/reports the following medications:  Current Outpatient Medications  Medication Sig Dispense Refill  . acetaminophen (TYLENOL) 325 MG tablet Take 2 tabs every 6 hours for the first 3 days after surgery, then every 6 hours as needed for pain. (Patient not taking: Reported on 06/27/2020)    . Cholecalciferol (VITAMIN D) 2000 units CAPS Take 1 capsule by mouth daily. (Patient not taking: Reported on 06/27/2020)    . ferrous sulfate 325 (65 FE) MG tablet Take 325 mg by mouth daily with breakfast. (Patient not taking: Reported on 06/27/2020)    . ibuprofen (ADVIL) 600 MG tablet  (Patient not taking: Reported on 06/27/2020)    . polyethylene glycol-electrolytes (NULYTELY) 420 g solution Take 4,000 mLs by mouth once for 1 dose. 4000 mL 0   No current facility-administered medications for this visit.    Patient confirms/reports the following allergies:  No Known Allergies  Orders Placed This Encounter  Procedures  . Procedural/ Surgical Case Request: COLONOSCOPY WITH PROPOFOL    Standing Status:   Standing    Number of Occurrences:   1     Order Specific Question:   Pre-op diagnosis    Answer:   screening colonoscopy    Order Specific Question:   CPT Code    Answer:   16109    AUTHORIZATION INFORMATION Primary Insurance: 1D#: Group #:  Secondary Insurance: 1D#: Group #:  SCHEDULE INFORMATION: Date: Friday 08/02/20 Time: Location:[facility]

## 2020-07-30 ENCOUNTER — Other Ambulatory Visit: Payer: Self-pay

## 2020-07-30 ENCOUNTER — Other Ambulatory Visit
Admission: RE | Admit: 2020-07-30 | Discharge: 2020-07-30 | Disposition: A | Discharge status: Home / Self Care | Payer: Managed Care, Other (non HMO) | Source: Ambulatory Visit | Attending: Gastroenterology | Admitting: Gastroenterology

## 2020-07-30 DIAGNOSIS — Z20822 Contact with and (suspected) exposure to covid-19: Secondary | ICD-10-CM | POA: Diagnosis not present

## 2020-07-30 DIAGNOSIS — Z01812 Encounter for preprocedural laboratory examination: Secondary | ICD-10-CM | POA: Insufficient documentation

## 2020-07-31 ENCOUNTER — Other Ambulatory Visit: Payer: Managed Care, Other (non HMO)

## 2020-07-31 LAB — SARS CORONAVIRUS 2 (TAT 6-24 HRS): SARS Coronavirus 2: NEGATIVE

## 2020-08-02 ENCOUNTER — Encounter: Payer: Self-pay | Admitting: Gastroenterology

## 2020-08-02 ENCOUNTER — Encounter
Admission: RE | Disposition: A | Discharge status: Home / Self Care | Payer: Self-pay | Source: Home / Self Care | Attending: Gastroenterology

## 2020-08-02 ENCOUNTER — Ambulatory Visit: Payer: Managed Care, Other (non HMO) | Admitting: Certified Registered Nurse Anesthetist

## 2020-08-02 ENCOUNTER — Other Ambulatory Visit: Payer: Self-pay

## 2020-08-02 ENCOUNTER — Ambulatory Visit
Admission: RE | Admit: 2020-08-02 | Discharge: 2020-08-02 | Disposition: A | Discharge status: Home / Self Care | Payer: Managed Care, Other (non HMO) | Attending: Gastroenterology | Admitting: Gastroenterology

## 2020-08-02 DIAGNOSIS — Z1211 Encounter for screening for malignant neoplasm of colon: Secondary | ICD-10-CM | POA: Diagnosis present

## 2020-08-02 HISTORY — PX: COLONOSCOPY WITH PROPOFOL: SHX5780

## 2020-08-02 SURGERY — COLONOSCOPY WITH PROPOFOL
Anesthesia: General

## 2020-08-02 MED ORDER — PROPOFOL 500 MG/50ML IV EMUL
INTRAVENOUS | Status: AC
  Filled 2020-08-02: qty 50

## 2020-08-02 MED ORDER — SODIUM CHLORIDE 0.9 % IV SOLN: INTRAVENOUS | Status: DC

## 2020-08-02 MED ORDER — LIDOCAINE HCL (CARDIAC) PF 100 MG/5ML IV SOSY
PREFILLED_SYRINGE | INTRAVENOUS | Status: DC | PRN
  Administered 2020-08-02: 50 mg via INTRAVENOUS

## 2020-08-02 MED ORDER — PROPOFOL 10 MG/ML IV BOLUS
INTRAVENOUS | Status: AC
  Filled 2020-08-02: qty 20

## 2020-08-02 MED ORDER — PROPOFOL 10 MG/ML IV BOLUS
INTRAVENOUS | Status: DC | PRN
  Administered 2020-08-02: 60 mg via INTRAVENOUS

## 2020-08-02 MED ORDER — PROPOFOL 500 MG/50ML IV EMUL
INTRAVENOUS | Status: DC | PRN
  Administered 2020-08-02: 150 ug/kg/min via INTRAVENOUS

## 2020-08-02 NOTE — H&P (Signed)
Jonathon Bellows, MD [address], [place], [place], [place], [address] [address], [place], [place], [place], [address] Phone: [phone]  Fax: [phone]  Primary Care Physician:  Jearld Fenton, NP   Pre-Procedure History & Physical: HPI:  Jo Goodwin is a 44 y.o. female is here for an colonoscopy.   Past Medical History:  Diagnosis Date  . Anemia   . Fibroids   . Frequent headaches    otc med prn  . GERD (gastroesophageal reflux disease)    diet controlled, no meds  . SVD (spontaneous vaginal delivery)    x 1    Past Surgical History:  Procedure Laterality Date  . ABDOMINAL HYSTERECTOMY    . DILATION AND EVACUATION N/A 12/21/2015   Procedure: DILATATION AND EVACUATION;  Surgeon: Servando Salina, MD;  Location: [facility];  Service: Gynecology;  Laterality: N/A;  . IR RADIOLOGIST EVAL & MGMT  11/22/2018  . NO PAST SURGERIES    . WISDOM TOOTH EXTRACTION      Prior to Admission medications   Medication Sig Start Date End Date Taking? Authorizing Provider  Multiple Vitamins-Minerals (MULTIVITAMIN WITH MINERALS) tablet Take 1 tablet by mouth daily.   Yes [provider]  acetaminophen (TYLENOL) 325 MG tablet Take 2 tabs every 6 hours for the first 3 days after surgery, then every 6 hours as needed for pain. Patient not taking: Reported on 06/27/2020 11/23/19   [provider]  Cholecalciferol (VITAMIN D) 2000 units CAPS Take 1 capsule by mouth daily. Patient not taking: Reported on 06/27/2020    [provider]  ferrous sulfate 325 (65 FE) MG tablet Take 325 mg by mouth daily with breakfast. Patient not taking: Reported on 06/27/2020    [provider]  ibuprofen (ADVIL) 600 MG tablet  11/23/19   [provider]    Allergies as of 07/15/2020  . (No Known Allergies)    Family History  Problem Relation Age of Onset  . Hypertension Father   . Diabetes Sister   . Hypertension Sister   . Diabetes Maternal  Grandmother   . Diabetes Maternal Grandfather   . Diabetes Paternal Grandmother   . Hypertension Paternal Grandmother   . Diabetes Paternal Grandfather   . Hypertension Paternal Grandfather     Social History   Socioeconomic History  . Marital status: Single    Spouse name: Not on file  . Number of children: Not on file  . Years of education: Not on file  . Highest education level: Not on file  Occupational History  . Not on file  Tobacco Use  . Smoking status: Never Smoker  . Smokeless tobacco: Never Used  Vaping Use  . Vaping Use: Never used  Substance and Sexual Activity  . Alcohol use: Yes    Comment: rare   . Drug use: No  . Sexual activity: Yes    Birth control/protection: None    Comment: approx [redacted] wks gestation  Other Topics Concern  . Not on file  Social History Narrative  . Not on file   Social Determinants of Health   Financial Resource Strain:   . Difficulty of Paying Living Expenses: Not on file  Food Insecurity:   . Worried About Charity fundraiser in the Last Year: Not on file  . Ran Out of Food in the Last Year: Not on file  Transportation Needs:   . Lack of Transportation (Medical): Not on file  . Lack of Transportation (  Non-Medical): Not on file  Physical Activity:   . Days of Exercise per Week: Not on file  . Minutes of Exercise per Session: Not on file  Stress:   . Feeling of Stress : Not on file  Social Connections:   . Frequency of Communication with Friends and Family: Not on file  . Frequency of Social Gatherings with Friends and Family: Not on file  . Attends Religious Services: Not on file  . Active Member of Clubs or Organizations: Not on file  . Attends Archivist Meetings: Not on file  . Marital Status: Not on file  Intimate Partner Violence:   . Fear of Current or Ex-Partner: Not on file  . Emotionally Abused: Not on file  . Physically Abused: Not on file  . Sexually Abused: Not on file    Review of Systems: See  HPI, otherwise negative ROS  Physical Exam: BP 139/90   Pulse 81   Temp (!) 97.3 F (36.3 C)   Resp 18   Ht 5' 7.5" (1.715 m)   Wt 113.4 kg   LMP 11/02/2018 (Exact Date)   SpO2 100%   BMI 38.58 kg/m  General:   Alert,  pleasant and cooperative in NAD Head:  Normocephalic and atraumatic. Neck:  Supple; no masses or thyromegaly. Lungs:  Clear throughout to auscultation, normal respiratory effort.    Heart:  +S1, +S2, Regular rate and rhythm, No edema. Abdomen:  Soft, nontender and nondistended. Normal bowel sounds, without guarding, and without rebound.   Neurologic:  Alert and  oriented x4;  grossly normal neurologically.  Impression/Plan: Jo Goodwin is here for an colonoscopy to be performed for Screening colonoscopy average risk   Risks, benefits, limitations, and alternatives regarding  colonoscopy have been reviewed with the patient.  Questions have been answered.  All parties agreeable.   Jonathon Bellows, MD  08/02/2020, 10:15 AM

## 2020-08-02 NOTE — Op Note (Signed)
[facility] Patient Name: Jo Goodwin Procedure Date: 08/02/2020 10:16 AM MRN: [id number] Account #: [id number] Date of Birth: 1976-06-11 Admit Type: Outpatient Age: 44 Room: [facility] ENDO ROOM 4 Gender: Female Note Status: Finalized Procedure:             Colonoscopy Indications:           Screening for colorectal malignant neoplasm Providers:             Jonathon Bellows MD, MD Referring MD:          Jearld Fenton (Referring MD) Medicines:             Monitored Anesthesia Care Complications:         No immediate complications. Procedure:             Pre-Anesthesia Assessment:                        - Prior to the procedure, a History and Physical was                         performed, and patient medications, allergies and                         sensitivities were reviewed. The patient's tolerance                         of previous anesthesia was reviewed.                        - The risks and benefits of the procedure and the                         sedation options and risks were discussed with the                         patient. All questions were answered and informed                         consent was obtained.                        - ASA Grade Assessment: II - A patient with mild                         systemic disease.                        After obtaining informed consent, the colonoscope was                         passed under direct vision. Throughout the procedure,                         the patient's blood pressure, pulse, and oxygen                         saturations were monitored continuously. The                         Colonoscope was introduced through the anus and  advanced to the the cecum, identified by the                         appendiceal orifice. The colonoscopy was performed                         with ease. The patient tolerated the procedure well.                         The quality of  the bowel preparation was good. Findings:      The perianal and digital rectal examinations were normal.      The entire examined colon appeared normal on direct and retroflexion       views. Impression:            - The entire examined colon is normal on direct and                         retroflexion views.                        - No specimens collected. Recommendation:        - Discharge patient to home (with escort).                        - Resume previous diet.                        - Continue present medications.                        - Repeat colonoscopy in 10 years for screening                         purposes. Procedure Code(s):     --- Professional ---                        [phone], Colonoscopy, flexible; diagnostic, including                         collection of specimen(s) by brushing or washing, when                         performed (separate procedure) Diagnosis Code(s):     --- Professional ---                        Z12.11, Encounter for screening for malignant neoplasm                         of colon CPT copyright 2019 American Medical Association. All rights reserved. The codes documented in this report are preliminary and upon coder review may  be revised to meet current compliance requirements. Jonathon Bellows, MD Jonathon Bellows MD, MD 08/02/2020 10:41:05 AM This report has been signed electronically. Number of Addenda: 0 Note Initiated On: 08/02/2020 10:16 AM Scope Withdrawal Time: 0 hours 8 minutes 43 seconds  Total Procedure Duration: 0 hours 13 minutes 2 seconds  Estimated Blood Loss:  Estimated blood loss: none.      [facility]

## 2020-08-02 NOTE — Transfer of Care (Signed)
Immediate Anesthesia Transfer of Care Note  Patient: Jo Goodwin  Procedure(s) Performed: COLONOSCOPY WITH PROPOFOL (N/A )  Patient Location: Endoscopy Unit  Anesthesia Type:General  Level of Consciousness: drowsy  Airway & Oxygen Therapy: Patient Spontanous Breathing  Post-op Assessment: Report given to RN and Post -op Vital signs reviewed and stable  Post vital signs: Reviewed and stable  Last Vitals:  Vitals Value Taken Time  BP 94/56 08/02/20 1041  Temp    Pulse 85 08/02/20 1042  Resp 15 08/02/20 1042  SpO2 97 % 08/02/20 1042  Vitals shown include unvalidated device data.  Last Pain:  Vitals:   08/02/20 0934  PainSc: 0-No pain         Complications: No complications documented.

## 2020-08-02 NOTE — Anesthesia Postprocedure Evaluation (Signed)
Anesthesia Post Note  Patient: Jo Goodwin  Procedure(s) Performed: COLONOSCOPY WITH PROPOFOL (N/A )  Patient location during evaluation: Endoscopy Anesthesia Type: General Level of consciousness: awake and alert Pain management: pain level controlled Vital Signs Assessment: post-procedure vital signs reviewed and stable Respiratory status: spontaneous breathing, nonlabored ventilation, respiratory function stable and patient connected to nasal cannula oxygen Cardiovascular status: blood pressure returned to baseline and stable Postop Assessment: no apparent nausea or vomiting Anesthetic complications: no   No complications documented.   Last Vitals:  Vitals:   08/02/20 1100 08/02/20 1106  BP: 116/87   Pulse: 80 73  Resp: 17 19  Temp:    SpO2: 100% 100%    Last Pain:  Vitals:   08/02/20 0934  PainSc: 0-No pain                 Martha Clan

## 2020-08-02 NOTE — Anesthesia Preprocedure Evaluation (Signed)
Anesthesia Evaluation  Patient identified by MRN, date of birth, ID band Patient awake    Reviewed: Allergy & Precautions, H&P , NPO status , Patient's Chart, lab work & pertinent test results, reviewed documented beta blocker date and time   History of Anesthesia Complications Negative for: history of anesthetic complications  Airway Mallampati: II  TM Distance: >3 FB Neck ROM: full    Dental  (+) Dental Advidsory Given, Caps, Teeth Intact   Pulmonary neg pulmonary ROS,    Pulmonary exam normal breath sounds clear to auscultation       Cardiovascular Exercise Tolerance: Good hypertension (history of, but no longer on meds.), (-) angina(-) Past MI and (-) Cardiac Stents Normal cardiovascular exam(-) dysrhythmias (-) Valvular Problems/Murmurs Rhythm:regular Rate:Normal     Neuro/Psych negative neurological ROS  negative psych ROS   GI/Hepatic Neg liver ROS, GERD  Controlled,  Endo/Other  negative endocrine ROS  Renal/GU negative Renal ROS  negative genitourinary   Musculoskeletal   Abdominal   Peds  Hematology negative hematology ROS (+)   Anesthesia Other Findings Past Medical History: No date: Anemia No date: Fibroids No date: Frequent headaches     Comment:  otc med prn No date: GERD (gastroesophageal reflux disease)     Comment:  diet controlled, no meds No date: SVD (spontaneous vaginal delivery)     Comment:  x 1   Reproductive/Obstetrics negative OB ROS                             Anesthesia Physical Anesthesia Plan  ASA: II  Anesthesia Plan: General   Post-op Pain Management:    Induction: Intravenous  PONV Risk Score and Plan: Propofol infusion and TIVA  Airway Management Planned: Natural Airway and Nasal Cannula  Additional Equipment:   Intra-op Plan:   Post-operative Plan:   Informed Consent: I have reviewed the patients History and Physical, chart, labs  and discussed the procedure including the risks, benefits and alternatives for the proposed anesthesia with the patient or authorized representative who has indicated his/her understanding and acceptance.     Dental Advisory Given  Plan Discussed with: Anesthesiologist, CRNA and Surgeon  Anesthesia Plan Comments:         Anesthesia Quick Evaluation

## 2020-08-03 NOTE — Progress Notes (Signed)
Line Busy X3.

## 2020-10-01 ENCOUNTER — Other Ambulatory Visit: Payer: Managed Care, Other (non HMO)

## 2020-10-02 ENCOUNTER — Other Ambulatory Visit: Payer: Managed Care, Other (non HMO)

## 2020-10-02 DIAGNOSIS — Z20822 Contact with and (suspected) exposure to covid-19: Secondary | ICD-10-CM

## 2020-10-04 LAB — NOVEL CORONAVIRUS, NAA: SARS-CoV-2, NAA: DETECTED — AB

## 2020-10-04 LAB — SARS-COV-2, NAA 2 DAY TAT

## 2020-10-17 ENCOUNTER — Encounter: Payer: Self-pay | Admitting: Internal Medicine

## 2021-01-14 ENCOUNTER — Ambulatory Visit: Payer: Managed Care, Other (non HMO) | Admitting: Family Medicine

## 2021-01-14 ENCOUNTER — Encounter: Payer: Self-pay | Admitting: Family Medicine

## 2021-01-14 ENCOUNTER — Other Ambulatory Visit: Payer: Self-pay

## 2021-01-14 VITALS — BP 122/84 | HR 86 | Temp 97.7°F | Ht 67.5 in | Wt 253.2 lb

## 2021-01-14 DIAGNOSIS — R0789 Other chest pain: Secondary | ICD-10-CM | POA: Diagnosis not present

## 2021-01-14 DIAGNOSIS — K219 Gastro-esophageal reflux disease without esophagitis: Secondary | ICD-10-CM | POA: Diagnosis not present

## 2021-01-14 DIAGNOSIS — R42 Dizziness and giddiness: Secondary | ICD-10-CM

## 2021-01-14 NOTE — Progress Notes (Signed)
Patient ID: Jo Goodwin, female    DOB: [birth year], 45 y.o.   MRN: [id number]  This visit was conducted in person.  BP 122/84   Pulse 86   Temp 97.7 F (36.5 C) (Temporal)   Ht 5' 7.5" (1.715 m)   Wt 253 lb 4 oz (114.9 kg)   LMP 11/02/2018 (Exact Date)   BMI 39.08 kg/m   BP Readings from Last 3 Encounters:  01/14/21 122/84  08/02/20 116/87  06/27/20 124/84    Orthostatic VS for the past 24 hrs (Last 3 readings):  BP- Lying BP- Standing at 3 minutes  01/14/21 0848 -- 130/88  01/14/21 0845 126/82 --    CC: [facility] f/u visit  Subjective:   HPI: ELLAMAY FORS is a 45 y.o. female presenting on 01/14/2021 for Hospitalization Follow-up (Seen at [facility] on 01/04/21, dx chest pain.  Prescribed naproxen and omeprazole.  Then given esomeprazole [has not started].  Pain lets off after burping. ) and Dizziness (C/o dizziness and HA when going to stand.  Started about 2 mos ago.)   [facility] records reviewed.  Recent [facility] visit 4/23 for 1 wk h/o intermittent chest discomfort. Treated as GI vs MSK with omeprazole 20mg  daily (transitioned to esomeprazole due to insurance coverage) and naprosyn 500mg  bid 1 wk course as well as maalox. EKG at that time was reassuring. No labwork checked.   Chest discomfort described as mid chest soreness reproducible with palpation, positional ie reaching to pick something up worsens pain. Denies inciting trauma/injury or fall.   Also notes increased burping but not heartburn or acid reflux. Does have h/o heartburn in the past.   Notes intermittent dizziness with standing. Some unsteadiness with ambulation. No dyspnea or nausea or abd pain. She feels she stays well hydrate ~48 oz water/day.   Personal h/o HTN - that resolved once megace was stopped (s/p hysterectomy). Previously on amlodipine (caused ankle swelling). She monitors BP with home cuff (130/80s)  Paternal grandmother with heart disease.      Relevant past medical, surgical,  family and social history reviewed and updated as indicated. Interim medical history since our last visit reviewed. Allergies and medications reviewed and updated. Outpatient Medications Prior to Visit  Medication Sig Dispense Refill  . OMEPRAZOLE PO Take by mouth daily.    Marland Kitchen esomeprazole (NEXIUM) 20 MG capsule Take 20 mg by mouth daily. (Patient not taking: Reported on 01/14/2021)    . naproxen (NAPROSYN) 500 MG tablet Take 500 mg by mouth 2 (two) times daily as needed. (Patient not taking: Reported on 01/14/2021)    . acetaminophen (TYLENOL) 325 MG tablet Take 2 tabs every 6 hours for the first 3 days after surgery, then every 6 hours as needed for pain. (Patient not taking: Reported on 06/27/2020)    . Cholecalciferol (VITAMIN D) 2000 units CAPS Take 1 capsule by mouth daily. (Patient not taking: Reported on 06/27/2020)    . ferrous sulfate 325 (65 FE) MG tablet Take 325 mg by mouth daily with breakfast. (Patient not taking: Reported on 06/27/2020)    . ibuprofen (ADVIL) 600 MG tablet  (Patient not taking: Reported on 06/27/2020)    . Multiple Vitamins-Minerals (MULTIVITAMIN WITH MINERALS) tablet Take 1 tablet by mouth daily.     No facility-administered medications prior to visit.     Per HPI unless specifically indicated in ROS section below Review of Systems Objective:  BP 122/84   Pulse 86   Temp 97.7 F (36.5 C) (Temporal)  Ht 5' 7.5" (1.715 m)   Wt 253 lb 4 oz (114.9 kg)   LMP 11/02/2018 (Exact Date)   BMI 39.08 kg/m   Wt Readings from Last 3 Encounters:  01/14/21 253 lb 4 oz (114.9 kg)  08/02/20 250 lb (113.4 kg)  06/27/20 254 lb (115.2 kg)      Physical Exam Vitals and nursing note reviewed.  Constitutional:      Appearance: Normal appearance. She is not ill-appearing.  Eyes:     Extraocular Movements: Extraocular movements intact.     Pupils: Pupils are equal, round, and reactive to light.  Neck:     Thyroid: No thyroid mass or thyroid tenderness.  Cardiovascular:      Rate and Rhythm: Normal rate and regular rhythm.     Pulses: Normal pulses.     Heart sounds: Normal heart sounds. No murmur heard.   Pulmonary:     Effort: Pulmonary effort is normal. No respiratory distress.     Breath sounds: Normal breath sounds. No wheezing, rhonchi or rales.  Chest:     Chest wall: Tenderness present. No mass.       Comments: Reproducible tenderness to palpation along 2rd/3rd costochondral junction on right Musculoskeletal:     Cervical back: Normal range of motion and neck supple. No rigidity.     Right lower leg: No edema.     Left lower leg: No edema.  Lymphadenopathy:     Cervical: No cervical adenopathy.  Skin:    General: Skin is warm and dry.     Findings: No rash.  Neurological:     Mental Status: She is alert.  Psychiatric:        Mood and Affect: Mood normal.        Behavior: Behavior normal.        Lab Results  Component Value Date   TSH 0.99 06/27/2020    Lab Results  Component Value Date   CREATININE 0.82 06/27/2020   BUN 11 06/27/2020   NA 138 06/27/2020   K 4.5 06/27/2020   CL 103 06/27/2020   CO2 30 06/27/2020    Lab Results  Component Value Date   WBC 7.7 06/27/2020   HGB 13.8 06/27/2020   HCT 41.5 06/27/2020   MCV 85.5 06/27/2020   PLT 258.0 06/27/2020    Lab Results  Component Value Date   CHOL 150 06/27/2020   HDL 34.60 (L) 06/27/2020   LDLCALC 99 06/27/2020   TRIG 81.0 06/27/2020   CHOLHDL 4 06/27/2020    Assessment & Plan:  This visit occurred during the SARS-CoV-2 public health emergency.  Safety protocols were in place, including screening questions prior to the visit, additional usage of staff PPE, and extensive cleaning of exam room while observing appropriate contact time as indicated for disinfecting solutions.   Problem List Items Addressed This Visit    GERD (gastroesophageal reflux disease)    Discussed PPIs      Relevant Medications   esomeprazole (NEXIUM) 20 MG capsule   OMEPRAZOLE PO    Chest wall pain - Primary    Given history and exam (reproducible, positional) anticipate R sided costochondritis. Discussed etiology, Handout provided. rec gentle stretching, heating pad, NSAID (oral and topical).  Discussed PPI use while on NSAID.  Update if not improving with treatment. Pt agrees with plan.       Dizziness    Describes mild vertigo - ?inner ear positional vertigo - will monitor for now.  No orders of the defined types were placed in this encounter.  No orders of the defined types were placed in this encounter.   Patient instructions: I think you have costochondritis on the right  Treat with heating pad, gentle stretching, naprosyn prescription strength for a week with food then may transition to over the counter strength (aleve) twice daily as needed. May also try topical voltaren gel (also over the counter).  To protect the stomach probably good idea to take esomeprazole or omeprazole daily while you're on the naprosyn.   Follow up plan: Return if symptoms worsen or fail to improve.  Ria Bush, MD

## 2021-01-14 NOTE — Assessment & Plan Note (Signed)
Given history and exam (reproducible, positional) anticipate R sided costochondritis. Discussed etiology, Handout provided. rec gentle stretching, heating pad, NSAID (oral and topical).  Discussed PPI use while on NSAID.  Update if not improving with treatment. Pt agrees with plan.

## 2021-01-14 NOTE — Assessment & Plan Note (Signed)
Describes mild vertigo - ?inner ear positional vertigo - will monitor for now.

## 2021-01-14 NOTE — Assessment & Plan Note (Signed)
Discussed PPIs

## 2021-01-14 NOTE — Patient Instructions (Signed)
I think you have costochondritis on the right  Treat with heating pad, gentle stretching, naprosyn prescription strength for a week with food then may transition to over the counter strength (aleve) twice daily as needed. May also try topical voltaren gel (also over the counter).  To protect the stomach probably good idea to take esomeprazole or omeprazole daily while you're on the naprosyn.   Costochondritis  Costochondritis is inflammation of the tissue (cartilage) that connects the ribs to the breastbone (sternum). This causes pain in the front of the chest. The pain usually starts slowly and involves more than one rib. What are the causes? The exact cause of this condition is not always known. It results from stress on the cartilage where your ribs attach to your sternum. The cause of this stress could be:  Chest injury.  Exercise or activity, such as lifting.  Severe coughing. What increases the risk? You are more likely to develop this condition if you:  Are female.  Are 15-20 years old.  Recently started a new exercise or work activity.  Have low levels of vitamin D.  Have a condition that makes you cough frequently. What are the signs or symptoms? The main symptom of this condition is chest pain. The pain:  Usually starts gradually and can be sharp or dull.  Gets worse with deep breathing, coughing, or exercise.  Gets better with rest.  May be worse when you press on the affected area of your ribs and sternum. How is this diagnosed? This condition is diagnosed based on your symptoms, your medical history, and a physical exam. Your health care provider will check for pain when pressing on your sternum. You may also have tests to rule out other causes of chest pain. These may include:  A chest X-ray to check for lung problems.  An ECG (electrocardiogram) to see if you have a heart problem that could be causing the pain.  An imaging scan to rule out a chest or rib  fracture. How is this treated? This condition usually goes away on its own over time. Your health care provider may prescribe an NSAID, such as ibuprofen, to reduce pain and inflammation. Treatment may also include:  Resting and avoiding activities that make pain worse.  Applying heat or ice to the area to reduce pain and inflammation.  Doing exercises to stretch your chest muscles. If these treatments do not help, your health care provider may inject a numbing medicine at the sternum-rib connection to help relieve the pain. Follow these instructions at home: Managing pain, stiffness, and swelling  If directed, put ice on the painful area. To do this: ? Put ice in a plastic bag. ? Place a towel between your skin and the bag. ? Leave the ice on for 20 minutes, 2-3 times a day.  If directed, apply heat to the affected area as often as told by your health care provider. Use the heat source that your health care provider recommends, such as a moist heat pack or a heating pad. ? Place a towel between your skin and the heat source. ? Leave the heat on for 20-30 minutes. ? Remove the heat if your skin turns bright red. This is especially important if you are unable to feel pain, heat, or cold. You may have a greater risk of getting burned.      Activity  Rest as told by your health care provider. Avoid activities that make pain worse. This includes any activities that use  chest, abdominal, and side muscles.  Do not lift anything that is heavier than 10 lb (4.5 kg), or the limit that you are told, until your health care provider says that it is safe.  Return to your normal activities as told by your health care provider. Ask your health care provider what activities are safe for you. General instructions  Take over-the-counter and prescription medicines only as told by your health care provider.  Keep all follow-up visits as told by your health care provider. This is important. Contact a  health care provider if:  You have chills or a fever.  Your pain does not go away or it gets worse.  You have a cough that does not go away. Get help right away if:  You have shortness of breath.  You have severe chest pain that is not relieved by medicines, heat, or ice. These symptoms may represent a serious problem that is an emergency. Do not wait to see if the symptoms will go away. Get medical help right away. Call your local emergency services (911 in the [place] Do not drive yourself to the hospital.  Summary  Costochondritis is inflammation of the tissue (cartilage) that connects the ribs to the breastbone (sternum).  This condition causes pain in the front of the chest.  Costochondritis results from stress on the cartilage where your ribs attach to your sternum.  Treatment may include medicines, rest, heat or ice, and exercises. This information is not intended to replace advice given to you by your health care provider. Make sure you discuss any questions you have with your health care provider. Document Revised: 07/14/2019 Document Reviewed: 07/14/2019 Elsevier Patient Education  2021 [employer].

## 2021-05-02 ENCOUNTER — Encounter: Payer: Self-pay | Admitting: Nurse Practitioner

## 2021-05-02 ENCOUNTER — Ambulatory Visit: Payer: Managed Care, Other (non HMO) | Admitting: Nurse Practitioner

## 2021-05-02 ENCOUNTER — Other Ambulatory Visit: Payer: Self-pay

## 2021-05-02 VITALS — BP 122/78 | HR 87 | Temp 97.3°F | Ht 67.5 in | Wt 260.0 lb

## 2021-05-02 DIAGNOSIS — R5383 Other fatigue: Secondary | ICD-10-CM | POA: Diagnosis not present

## 2021-05-02 DIAGNOSIS — Z0000 Encounter for general adult medical examination without abnormal findings: Secondary | ICD-10-CM | POA: Diagnosis not present

## 2021-05-02 DIAGNOSIS — Z1321 Encounter for screening for nutritional disorder: Secondary | ICD-10-CM | POA: Diagnosis not present

## 2021-05-02 LAB — COMPREHENSIVE METABOLIC PANEL
ALT: 17 U/L (ref 0–35)
AST: 15 U/L (ref 0–37)
Albumin: 4 g/dL (ref 3.5–5.2)
Alkaline Phosphatase: 79 U/L (ref 39–117)
BUN: 13 mg/dL (ref 6–23)
CO2: 28 mEq/L (ref 19–32)
Calcium: 9.1 mg/dL (ref 8.4–10.5)
Chloride: 104 mEq/L (ref 96–112)
Creatinine, Ser: 0.9 mg/dL (ref 0.40–1.20)
GFR: 77.31 mL/min (ref >60.00)
Glucose, Bld: 83 mg/dL (ref 70–99)
Potassium: 4.4 mEq/L (ref 3.5–5.1)
Sodium: 139 mEq/L (ref 135–145)
Total Bilirubin: 0.5 mg/dL (ref 0.2–1.2)
Total Protein: 7 g/dL (ref 6.0–8.3)

## 2021-05-02 LAB — CBC
HCT: 43.3 % (ref 36.0–46.0)
Hemoglobin: 14.5 g/dL (ref 12.0–15.0)
MCHC: 33.5 g/dL (ref 30.0–36.0)
MCV: 86.9 fl (ref 78.0–100.0)
Platelets: 253 10*3/uL (ref 150.0–400.0)
RBC: 4.98 Mil/uL (ref 3.87–5.11)
RDW: 13.4 % (ref 11.5–15.5)
WBC: 6 10*3/uL (ref 4.0–10.5)

## 2021-05-02 LAB — LIPID PANEL
Cholesterol: 155 mg/dL (ref 0–200)
HDL: 40.6 mg/dL (ref >39.00)
LDL Cholesterol: 102 mg/dL — ABNORMAL HIGH (ref 0–99)
NonHDL: 114.31
Total CHOL/HDL Ratio: 4
Triglycerides: 60 mg/dL (ref 0.0–149.0)
VLDL: 12 mg/dL (ref 0.0–40.0)

## 2021-05-02 LAB — HEMOGLOBIN A1C: Hgb A1c MFr Bld: 5.3 % (ref 4.6–6.5)

## 2021-05-02 LAB — VITAMIN D 25 HYDROXY (VIT D DEFICIENCY, FRACTURES): VITD: 22.12 ng/mL — ABNORMAL LOW (ref 30.00–100.00)

## 2021-05-02 LAB — TSH: TSH: 0.9 u[IU]/mL (ref 0.35–5.50)

## 2021-05-02 NOTE — Assessment & Plan Note (Signed)
We will rule out metabolic reasons for fatigue first.  Patient had a very low PHQ-9 and GAD-7 score in office.  Patient did mention that she does snore sleeps approximately 5 to 5-1/2 hours a night.  If metabolic reasons are ruled out we can consider discussing with the patient about sleep study to rule out sleep apnea.  Pending lab results

## 2021-05-02 NOTE — Patient Instructions (Signed)
Pleasure seeing you today. Continue to work on diet and exercise. We shoot for a goal of 65mns 5 times weekly. I will be in touch with your labs

## 2021-05-02 NOTE — Progress Notes (Signed)
Established Patient Office Visit  Subjective:  Patient ID: Jo Goodwin, female    DOB: 1975-09-17  Age: 45 y.o. MRN: [id number]  CC:  Chief Complaint  Patient presents with   Transfer of Care    Transfer from [facility] Has biometric form that needs to be filled out.    HPI SIREN SHAMPINE presents for complete physical and follow up of chronic conditions.  Immunizations: -Tetanus: 07/01/2018 -Influenza: OOS does not take -Covid-19: 01/13/2020, 02/05/2020 -Shingles: NA -Pneumonia: NA  -HPV: never gotten  Diet: no particular at least eat twice daily. Sometimes 3 meals aday Exercise: No regular exercise.  Eye exam: Completes annually Due 06/2021 Dental exam: Completed  Pap Smear: Completed in 2020 hysterectomy since Mammogram: never completed. No family history of breast cancers Colonoscopy: Completed in  08/02/2020   Has tried a cigar in the past Drinks beer wine and liquor. 2 glasses or wine approx 2-3 times. 5-5.5 hours of sleep at night. Does snore  Past Medical History:  Diagnosis Date   Anemia    Fibroids    Frequent headaches    otc med prn   GERD (gastroesophageal reflux disease)    diet controlled, no meds   SVD (spontaneous vaginal delivery)    x 1    Past Surgical History:  Procedure Laterality Date   ABDOMINAL HYSTERECTOMY     COLONOSCOPY WITH PROPOFOL N/A 08/02/2020   Procedure: COLONOSCOPY WITH PROPOFOL;  Surgeon: Jonathon Bellows, MD;  Location: [facility];  Service: Gastroenterology;  Laterality: N/A;   DILATION AND EVACUATION N/A 12/21/2015   Procedure: DILATATION AND EVACUATION;  Surgeon: Servando Salina, MD;  Location: [facility];  Service: Gynecology;  Laterality: N/A;   IR RADIOLOGIST EVAL & MGMT  11/22/2018   NO PAST SURGERIES     WISDOM TOOTH EXTRACTION      Family History  Problem Relation Age of Onset   Hypertension Father    Diabetes Sister    Hypertension Sister    Diabetes Maternal Grandmother    Diabetes Maternal  Grandfather    Diabetes Paternal Grandmother    Hypertension Paternal Grandmother    Diabetes Paternal Grandfather    Hypertension Paternal Grandfather     Social History   Socioeconomic History   Marital status: Single    Spouse name: Not on file   Number of children: Not on file   Years of education: Not on file   Highest education level: Not on file  Occupational History   Not on file  Tobacco Use   Smoking status: Never   Smokeless tobacco: Never  Vaping Use   Vaping Use: Never used  Substance and Sexual Activity   Alcohol use: Yes    Comment: rare    Drug use: No   Sexual activity: Yes    Birth control/protection: None    Comment: approx [redacted] wks gestation  Other Topics Concern   Not on file  Social History Narrative   Not on file   Social Determinants of Health   Financial Resource Strain: Not on file  Food Insecurity: Not on file  Transportation Needs: Not on file  Physical Activity: Not on file  Stress: Not on file  Social Connections: Not on file  Intimate Partner Violence: Not on file    Outpatient Medications Prior to Visit  Medication Sig Dispense Refill   esomeprazole (NEXIUM) 20 MG capsule Take 20 mg by mouth daily. (Patient not taking: Reported on 01/14/2021)     naproxen (NAPROSYN) 500  MG tablet Take 500 mg by mouth 2 (two) times daily as needed. (Patient not taking: Reported on 01/14/2021)     OMEPRAZOLE PO Take by mouth daily.     No facility-administered medications prior to visit.    No Known Allergies  ROS Review of Systems  Constitutional:  Positive for fatigue. Negative for chills and fever.  Eyes:  Negative for visual disturbance.  Respiratory:  Negative for cough.   Cardiovascular:  Negative for chest pain, palpitations and leg swelling.  Gastrointestinal:  Positive for constipation. Negative for abdominal pain, blood in stool, diarrhea, nausea and vomiting.  Genitourinary:  Positive for frequency. Negative for dysuria, hematuria,  vaginal bleeding and vaginal pain.  Neurological:  Negative for numbness and headaches.  Psychiatric/Behavioral:  Negative for hallucinations and suicidal ideas.      Objective:    Physical Exam Vitals and nursing note reviewed.  Constitutional:      Appearance: She is obese.  HENT:     Right Ear: Tympanic membrane, ear canal and external ear normal. There is no impacted cerumen.     Left Ear: Tympanic membrane, ear canal and external ear normal. There is no impacted cerumen.     Mouth/Throat:     Mouth: Mucous membranes are moist.     Pharynx: Oropharynx is clear.  Eyes:     Extraocular Movements: Extraocular movements intact.     Pupils: Pupils are equal, round, and reactive to light.  Neck:     Vascular: No carotid bruit.  Cardiovascular:     Rate and Rhythm: Normal rate and regular rhythm.     Heart sounds: No murmur heard. Pulmonary:     Effort: Pulmonary effort is normal.     Breath sounds: Normal breath sounds. No wheezing.  Abdominal:     General: Bowel sounds are normal.     Palpations: Abdomen is soft. There is no mass.     Tenderness: There is no abdominal tenderness.  Musculoskeletal:     Right lower leg: No edema.     Left lower leg: No edema.  Lymphadenopathy:     Cervical: No cervical adenopathy.  Skin:    General: Skin is warm.  Neurological:     General: No focal deficit present.     Mental Status: She is alert.     Motor: No weakness.     Gait: Gait normal.  Psychiatric:        Mood and Affect: Mood normal.        Behavior: Behavior normal.        Thought Content: Thought content normal.        Judgment: Judgment normal.    BP 122/78 (BP Location: Left Arm, Patient Position: Sitting, Cuff Size: Large)   Pulse 87   Temp (!) 97.3 F (36.3 C)   Ht 5' 7.5" (1.715 m)   Wt 260 lb (117.9 kg)   LMP 11/02/2018 (Exact Date)   SpO2 99%   BMI 40.12 kg/m  Wt Readings from Last 3 Encounters:  05/02/21 260 lb (117.9 kg)  01/14/21 253 lb 4 oz (114.9  kg)  08/02/20 250 lb (113.4 kg)     Health Maintenance Due  Topic Date Due   COVID-19 Vaccine (3 - Booster for Pfizer series) 07/07/2020   INFLUENZA VACCINE  04/14/2021    There are no preventive care reminders to display for this patient.  Lab Results  Component Value Date   TSH 0.99 06/27/2020   Lab Results  Component Value  Date   WBC 7.7 06/27/2020   HGB 13.8 06/27/2020   HCT 41.5 06/27/2020   MCV 85.5 06/27/2020   PLT 258.0 06/27/2020   Lab Results  Component Value Date   NA 138 06/27/2020   K 4.5 06/27/2020   CO2 30 06/27/2020   GLUCOSE 80 06/27/2020   BUN 11 06/27/2020   CREATININE 0.82 06/27/2020   BILITOT 0.4 06/27/2020   ALKPHOS 86 06/27/2020   AST 15 06/27/2020   ALT 14 06/27/2020   PROT 7.0 06/27/2020   ALBUMIN 4.0 06/27/2020   CALCIUM 9.0 06/27/2020   GFR 86.74 06/27/2020   Lab Results  Component Value Date   CHOL 150 06/27/2020   Lab Results  Component Value Date   HDL 34.60 (L) 06/27/2020   Lab Results  Component Value Date   LDLCALC 99 06/27/2020   Lab Results  Component Value Date   TRIG 81.0 06/27/2020   Lab Results  Component Value Date   CHOLHDL 4 06/27/2020   Lab Results  Component Value Date   HGBA1C 5.4 06/27/2020      Assessment & Plan:   Problem List Items Addressed This Visit       Other   Preventative health care - Primary    Patient here for annual visit.  Did have a form that needed to be signed by healthcare professional and had instructions for certain labs.  Signed form return to patient made copy for chart.  Ordered appropriate labs pending results.  Gave information that encouraged lifestyle modifications.  Pending lab results      Relevant Orders   CBC   Comprehensive metabolic panel   Hemoglobin A1c   Lipid panel   Encounter for vitamin deficiency screening   Relevant Orders   VITAMIN D 25 Hydroxy (Vit-D Deficiency, Fractures)   Fatigue    We will rule out metabolic reasons for fatigue first.   Patient had a very low PHQ-9 and GAD-7 score in office.  Patient did mention that she does snore sleeps approximately 5 to 5-1/2 hours a night.  If metabolic reasons are ruled out we can consider discussing with the patient about sleep study to rule out sleep apnea.  Pending lab results      Relevant Orders   TSH    No orders of the defined types were placed in this encounter.   Follow-up: Return in about 1 year (around 05/02/2022).   This visit occurred during the SARS-CoV-2 public health emergency.  Safety protocols were in place, including screening questions prior to the visit, additional usage of staff PPE, and extensive cleaning of exam room while observing appropriate contact time as indicated for disinfecting solutions.   Romilda Garret, NP

## 2021-05-02 NOTE — Assessment & Plan Note (Signed)
Patient here for annual visit.  Did have a form that needed to be signed by healthcare professional and had instructions for certain labs.  Signed form return to patient made copy for chart.  Ordered appropriate labs pending results.  Gave information that encouraged lifestyle modifications.  Pending lab results

## 2021-05-15 IMAGING — DX DG LUMBAR SPINE COMPLETE 4+V
5 series · 5 of 5 positions shown · non-contrast
Comparison: None.

CLINICAL DATA: Chronic bilateral low back pain

EXAM:
LUMBAR SPINE - COMPLETE 4+ VIEW

[l-spine ap]
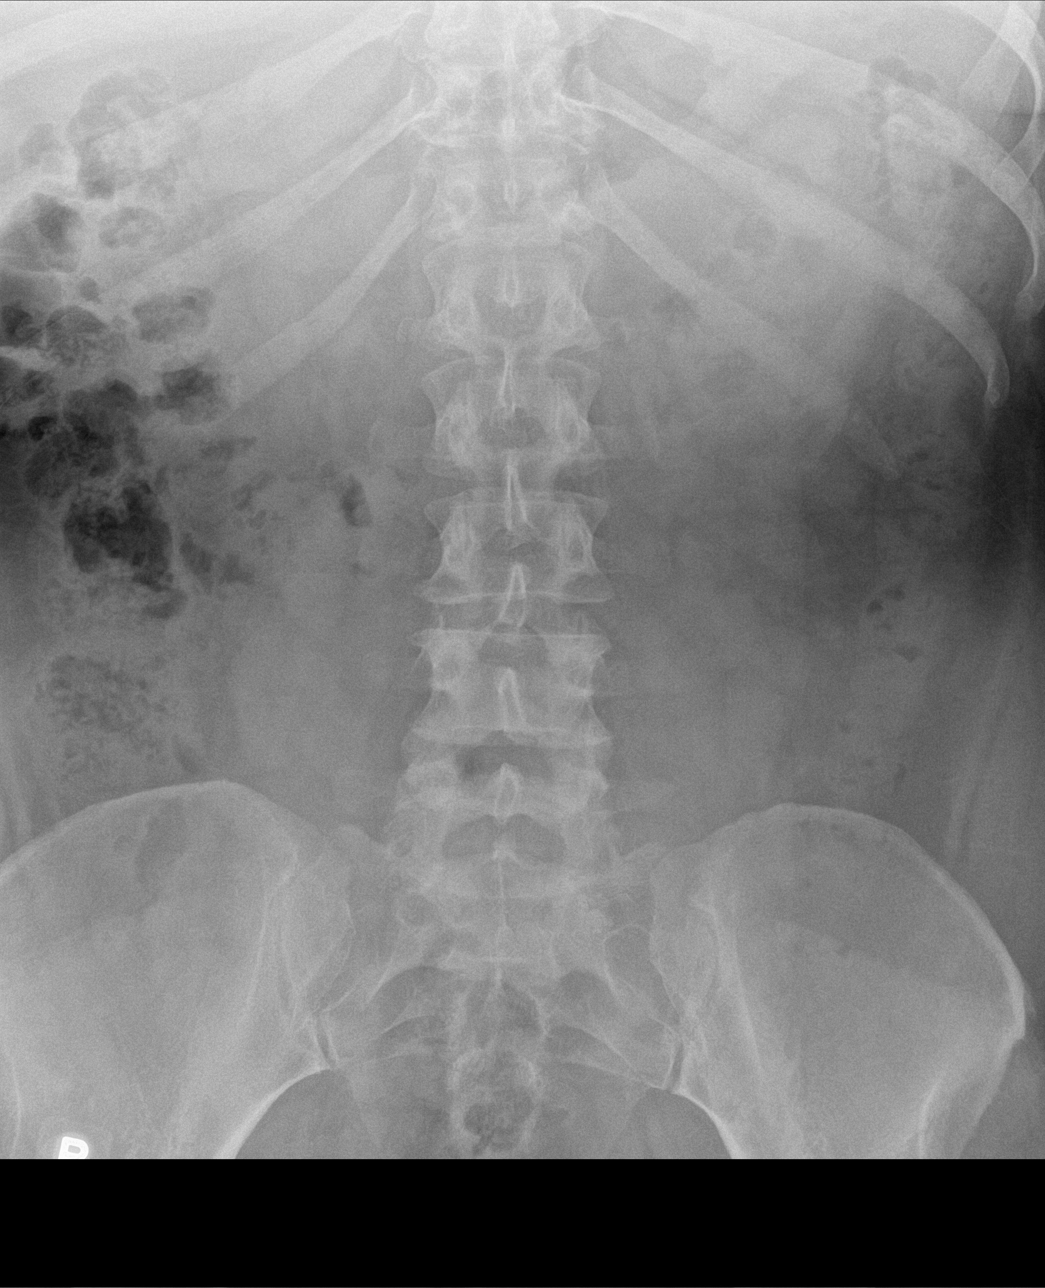

[l-spine obl (1 of 2)]
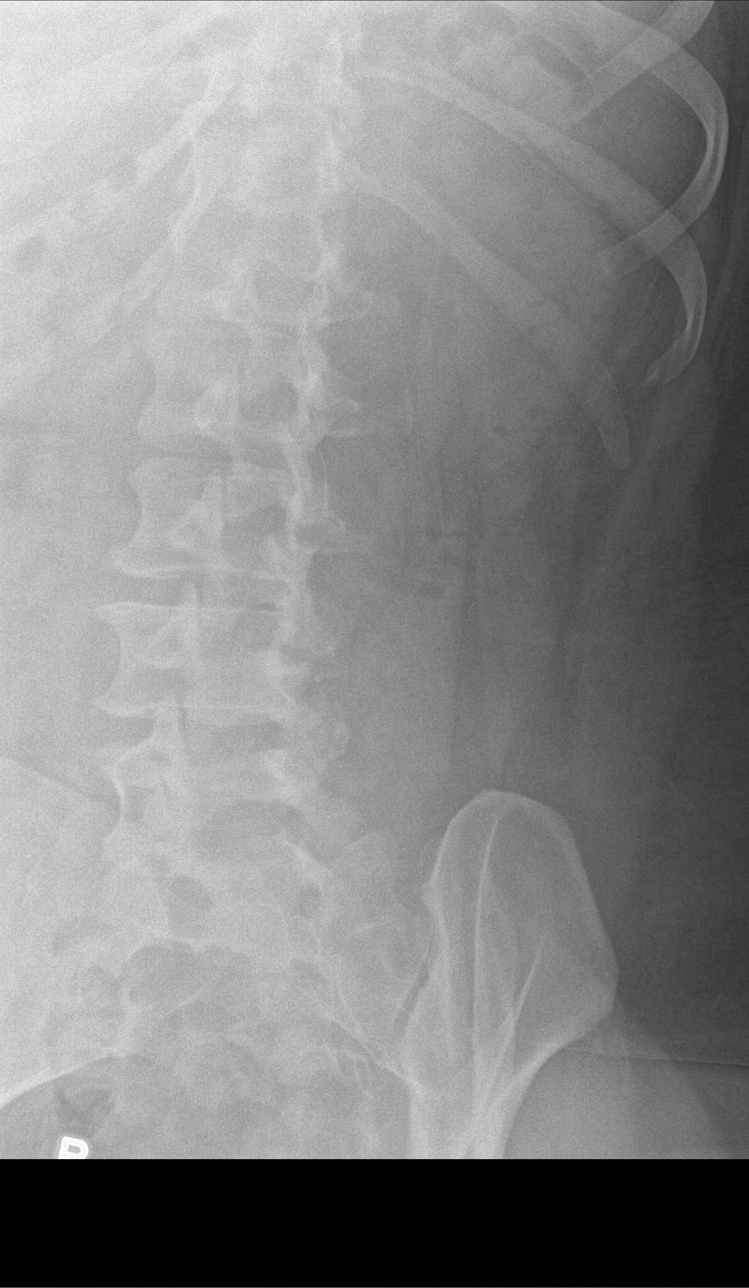

[l-spine obl (2 of 2)]
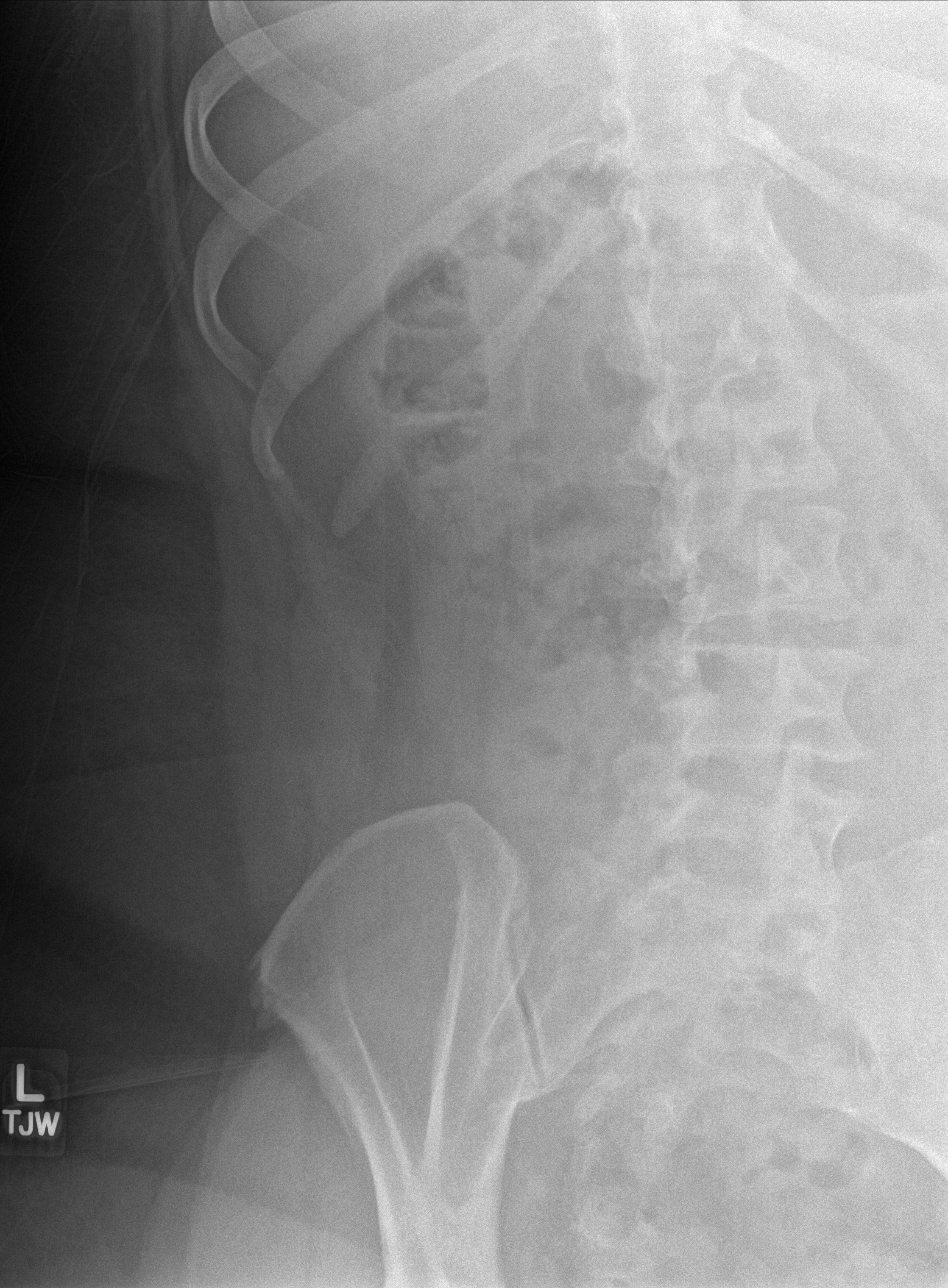

[l-spine lat]
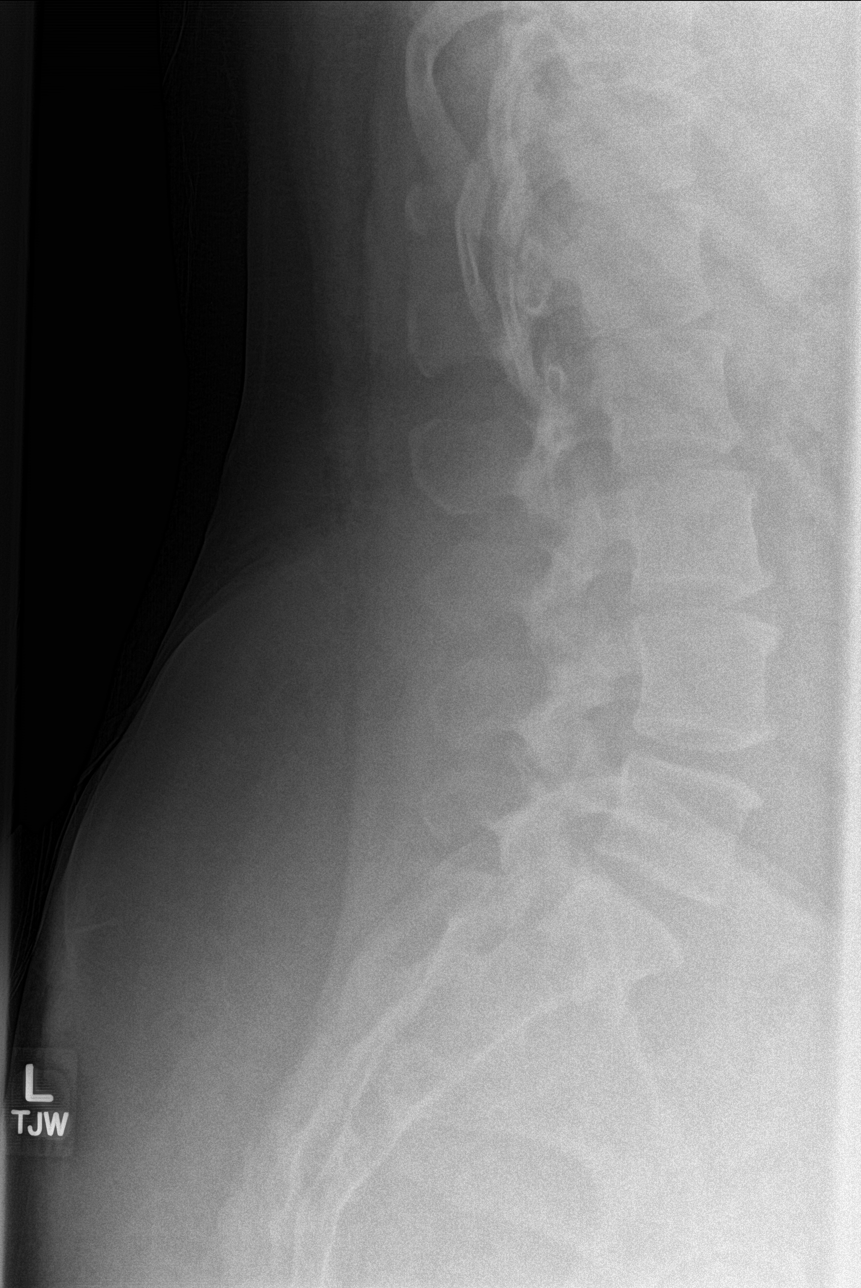

[l-spine l5/s1]
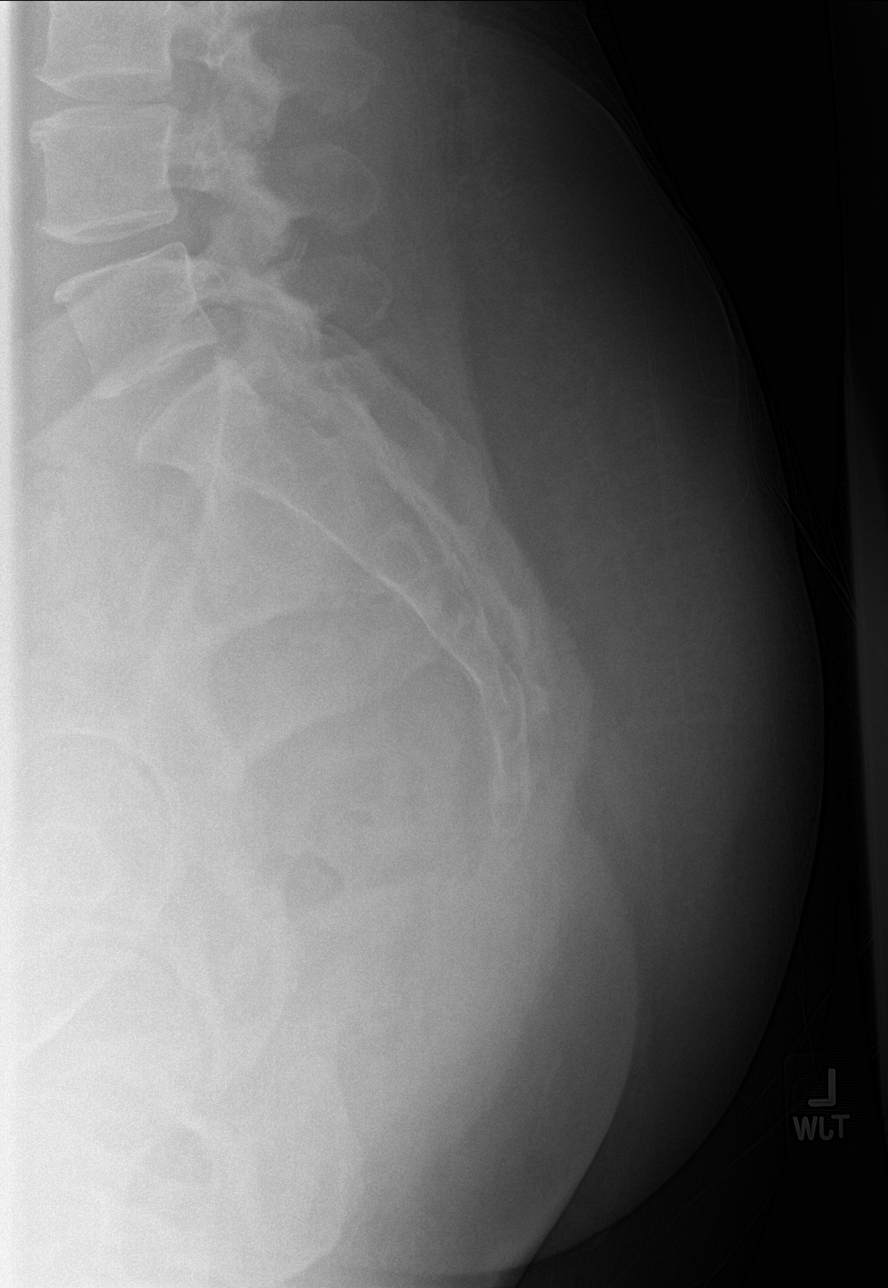

[5 of 5 positions shown; findings below may reference images not displayed]

FINDINGS: There is no evidence of lumbar spine fracture. Alignment is normal.
Intervertebral disc spaces are maintained.
IMPRESSION: Negative.

## 2022-03-24 DIAGNOSIS — N8110 Cystocele, unspecified: Secondary | ICD-10-CM | POA: Diagnosis not present

## 2022-03-24 DIAGNOSIS — N9489 Other specified conditions associated with female genital organs and menstrual cycle: Secondary | ICD-10-CM | POA: Diagnosis not present

## 2022-03-24 DIAGNOSIS — N3946 Mixed incontinence: Secondary | ICD-10-CM | POA: Diagnosis not present

## 2022-03-24 DIAGNOSIS — R350 Frequency of micturition: Secondary | ICD-10-CM | POA: Diagnosis not present

## 2022-04-07 DIAGNOSIS — R350 Frequency of micturition: Secondary | ICD-10-CM | POA: Diagnosis not present

## 2022-04-07 DIAGNOSIS — N3946 Mixed incontinence: Secondary | ICD-10-CM | POA: Diagnosis not present

## 2022-04-07 DIAGNOSIS — N9489 Other specified conditions associated with female genital organs and menstrual cycle: Secondary | ICD-10-CM | POA: Diagnosis not present

## 2022-04-07 DIAGNOSIS — N8110 Cystocele, unspecified: Secondary | ICD-10-CM | POA: Diagnosis not present

## 2022-05-05 DIAGNOSIS — N8110 Cystocele, unspecified: Secondary | ICD-10-CM | POA: Diagnosis not present

## 2022-05-05 DIAGNOSIS — N3946 Mixed incontinence: Secondary | ICD-10-CM | POA: Diagnosis not present

## 2022-05-05 DIAGNOSIS — R350 Frequency of micturition: Secondary | ICD-10-CM | POA: Diagnosis not present

## 2022-05-05 DIAGNOSIS — N9489 Other specified conditions associated with female genital organs and menstrual cycle: Secondary | ICD-10-CM | POA: Diagnosis not present

## 2022-06-16 ENCOUNTER — Ambulatory Visit: Payer: BC Managed Care – PPO | Admitting: Nurse Practitioner

## 2022-06-16 ENCOUNTER — Encounter: Payer: Self-pay | Admitting: Nurse Practitioner

## 2022-06-16 VITALS — BP 130/82 | HR 78 | Temp 97.4°F | Ht 67.5 in | Wt 256.5 lb

## 2022-06-16 DIAGNOSIS — Z0000 Encounter for general adult medical examination without abnormal findings: Secondary | ICD-10-CM | POA: Diagnosis not present

## 2022-06-16 DIAGNOSIS — I951 Orthostatic hypotension: Secondary | ICD-10-CM | POA: Diagnosis not present

## 2022-06-16 DIAGNOSIS — E6609 Other obesity due to excess calories: Secondary | ICD-10-CM | POA: Diagnosis not present

## 2022-06-16 DIAGNOSIS — I158 Other secondary hypertension: Secondary | ICD-10-CM

## 2022-06-16 DIAGNOSIS — Z1231 Encounter for screening mammogram for malignant neoplasm of breast: Secondary | ICD-10-CM

## 2022-06-16 DIAGNOSIS — Z13220 Encounter for screening for lipoid disorders: Secondary | ICD-10-CM | POA: Diagnosis not present

## 2022-06-16 DIAGNOSIS — Z6839 Body mass index (BMI) 39.0-39.9, adult: Secondary | ICD-10-CM

## 2022-06-16 DIAGNOSIS — Z131 Encounter for screening for diabetes mellitus: Secondary | ICD-10-CM

## 2022-06-16 NOTE — Patient Instructions (Addendum)
Nice to see you today I will be in touch with the labs once I have the results Follow up with me in 1 year, sooner if you need me

## 2022-06-16 NOTE — Assessment & Plan Note (Addendum)
Having signs and symptoms of orthostatic hypotension.  Nothing in office today.  Did discuss safety options about changing positions slowly and staying adequately hydrated.

## 2022-06-16 NOTE — Progress Notes (Signed)
Established Patient Office Visit  Subjective   Patient ID: Jo Goodwin, female    DOB: July 11, 1976  Age: 46 y.o. MRN: [id number]  Chief Complaint  Patient presents with   Annual Exam    HPI  for complete physical and follow up of chronic conditions.  Immunizations: -Tetanus:2019 -Influenza: refused -Shingles: Too young -Pneumonia: Too young  -HPV: Aged out  Diet: Fair diet.  2 meals a day. Cutting back on coffee. Will do 3 coffees a week. Working on getting her water intake. States that she recently go a water.  dispenser at work. Has cut down on sodas most of the time with her bourbon Exercise: No regular exercise.  Eye exam: Completes annually. Contact and glasses. [place] Coming up appt  Dental exam: Use to see LTR. Needs updating   Pap Smear: hysterectomy.  Patient is searching for a GYN to establish with to have regular vaginal exams. Mammogram: Needs updating [facility]   Ambulatory order placed today   Colonoscopy: Completed in 08/02/2020, recall 10 years Lung Cancer Screening: N/A Dexa: N/A  Sleep: tries to be in the bed 1030 and gets up 545. Does not feel rested. Do snore  Refer to [facility]    Dizziness: states that when she gets up and stands up and feels woozy. Feels like lightheaded or wozzy. Always with a position change     Review of Systems  Constitutional:  Positive for malaise/fatigue. Negative for chills and fever.  Respiratory:  Negative for shortness of breath.   Cardiovascular:  Negative for chest pain.  Gastrointestinal:  Negative for abdominal pain, blood in stool, constipation, diarrhea, nausea and vomiting.       BM every other day Incomplete emptying   Genitourinary:  Positive for frequency. Negative for dysuria and hematuria.  Neurological:  Positive for dizziness and headaches (frequent).  Psychiatric/Behavioral:  Negative for hallucinations and suicidal ideas.       Objective:     BP  130/82   Pulse 78   Temp (!) 97.4 F (36.3 C) (Temporal)   Ht 5' 7.5" (1.715 m)   Wt 256 lb 8 oz (116.3 kg)   LMP 11/02/2018 (Exact Date)   SpO2 100%   BMI 39.58 kg/m  BP Readings from Last 3 Encounters:  06/16/22 130/82  05/02/21 122/78  01/14/21 122/84   Wt Readings from Last 3 Encounters:  06/16/22 256 lb 8 oz (116.3 kg)  05/02/21 260 lb (117.9 kg)  01/14/21 253 lb 4 oz (114.9 kg)      Physical Exam Vitals and nursing note reviewed.  Constitutional:      Appearance: Normal appearance. She is obese.  HENT:     Right Ear: Tympanic membrane, ear canal and external ear normal.     Left Ear: Tympanic membrane, ear canal and external ear normal.     Mouth/Throat:     Mouth: Mucous membranes are moist.     Pharynx: Oropharynx is clear.  Eyes:     Extraocular Movements: Extraocular movements intact.     Pupils: Pupils are equal, round, and reactive to light.  Cardiovascular:     Rate and Rhythm: Normal rate and regular rhythm.     Pulses: Normal pulses.     Heart sounds: Normal heart sounds.  Pulmonary:     Effort: Pulmonary effort is normal.     Breath sounds: Normal breath sounds.  Abdominal:     General: Bowel sounds are normal. There is no distension.  Palpations: There is no mass.     Tenderness: There is no abdominal tenderness.     Hernia: No hernia is present.  Musculoskeletal:     Right lower leg: No edema.     Left lower leg: No edema.  Lymphadenopathy:     Cervical: No cervical adenopathy.  Skin:    General: Skin is warm.  Neurological:     General: No focal deficit present.     Mental Status: She is alert.     Deep Tendon Reflexes:     Reflex Scores:      Bicep reflexes are 1+ on the right side and 1+ on the left side.      Patellar reflexes are 1+ on the right side and 1+ on the left side.    Comments: Bilateral upper and lower extremity strength 5/5  Psychiatric:        Mood and Affect: Mood normal.        Behavior: Behavior normal.         Thought Content: Thought content normal.        Judgment: Judgment normal.      No results found for any visits on 06/16/22.    The 10-year ASCVD risk score (Arnett DK, et al., 2019) is: 1.8%    Assessment & Plan:   Problem List Items Addressed This Visit       Cardiovascular and Mediastinum   HTN (hypertension)    Currently maintained on diet.  Blood pressure within normal limits.  Patient will check blood pressure infrequently at home.      Orthostatic hypotension    Having signs and symptoms of orthostatic hypotension.  Nothing in office today.  Did discuss safety options about changing positions slowly and staying adequately hydrated.      Relevant Orders   TSH     Other   Preventative health care - Primary    Discussed age-appropriate immunizations and screening exams today.  Wellness form filled out for patient's employer that physical was performed today.  Made copy was sent to scanned to chart.      Relevant Orders   CBC   Comprehensive metabolic panel   Hemoglobin A1c   Lipid panel   TSH   Class 2 obesity due to excess calories without serious comorbidity with body mass index (BMI) of 39.0 to 39.9 in adult    Discussed healthy lifestyle modifications inclusive of exercise 30 minutes a day 5 times a week.  Patient has lost approximately 4 pounds since last office visit.      Relevant Orders   Hemoglobin A1c   Lipid panel   TSH   Other Visit Diagnoses     Screening for lipoid disorders       Relevant Orders   Lipid panel   Screening for diabetes mellitus       Relevant Orders   Hemoglobin A1c   Encounter for screening mammogram for malignant neoplasm of breast       Relevant Orders   MM Digital Screening       Return in about 1 year (around 06/17/2023) for CPe and labs.    Romilda Garret, NP

## 2022-06-16 NOTE — Assessment & Plan Note (Signed)
Discussed healthy lifestyle modifications inclusive of exercise 30 minutes a day 5 times a week.  Patient has lost approximately 4 pounds since last office visit.

## 2022-06-16 NOTE — Assessment & Plan Note (Signed)
Currently maintained on diet.  Blood pressure within normal limits.  Patient will check blood pressure infrequently at home.

## 2022-06-16 NOTE — Assessment & Plan Note (Addendum)
Discussed age-appropriate immunizations and screening exams today.  Wellness form filled out for patient's employer that physical was performed today.  Made copy was sent to scanned to chart.

## 2022-06-18 ENCOUNTER — Other Ambulatory Visit (INDEPENDENT_AMBULATORY_CARE_PROVIDER_SITE_OTHER): Payer: BC Managed Care – PPO

## 2022-06-18 DIAGNOSIS — E6609 Other obesity due to excess calories: Secondary | ICD-10-CM

## 2022-06-18 DIAGNOSIS — Z6839 Body mass index (BMI) 39.0-39.9, adult: Secondary | ICD-10-CM

## 2022-06-18 DIAGNOSIS — I951 Orthostatic hypotension: Secondary | ICD-10-CM | POA: Diagnosis not present

## 2022-06-18 DIAGNOSIS — Z131 Encounter for screening for diabetes mellitus: Secondary | ICD-10-CM

## 2022-06-18 DIAGNOSIS — Z0000 Encounter for general adult medical examination without abnormal findings: Secondary | ICD-10-CM | POA: Diagnosis not present

## 2022-06-18 DIAGNOSIS — Z13220 Encounter for screening for lipoid disorders: Secondary | ICD-10-CM | POA: Diagnosis not present

## 2022-06-18 LAB — LIPID PANEL
Cholesterol: 138 mg/dL (ref 0–200)
HDL: 34.1 mg/dL — ABNORMAL LOW (ref >39.00)
LDL Cholesterol: 88 mg/dL (ref 0–99)
NonHDL: 103.87
Total CHOL/HDL Ratio: 4
Triglycerides: 77 mg/dL (ref 0.0–149.0)
VLDL: 15.4 mg/dL (ref 0.0–40.0)

## 2022-06-18 LAB — COMPREHENSIVE METABOLIC PANEL
ALT: 12 U/L (ref 0–35)
AST: 14 U/L (ref 0–37)
Albumin: 3.9 g/dL (ref 3.5–5.2)
Alkaline Phosphatase: 69 U/L (ref 39–117)
BUN: 12 mg/dL (ref 6–23)
CO2: 27 mEq/L (ref 19–32)
Calcium: 8.8 mg/dL (ref 8.4–10.5)
Chloride: 104 mEq/L (ref 96–112)
Creatinine, Ser: 1 mg/dL (ref 0.40–1.20)
GFR: 67.59 mL/min (ref >60.00)
Glucose, Bld: 98 mg/dL (ref 70–99)
Potassium: 4.3 mEq/L (ref 3.5–5.1)
Sodium: 138 mEq/L (ref 135–145)
Total Bilirubin: 0.4 mg/dL (ref 0.2–1.2)
Total Protein: 6.6 g/dL (ref 6.0–8.3)

## 2022-06-18 LAB — CBC
HCT: 41.1 % (ref 36.0–46.0)
Hemoglobin: 13.8 g/dL (ref 12.0–15.0)
MCHC: 33.6 g/dL (ref 30.0–36.0)
MCV: 85.1 fl (ref 78.0–100.0)
Platelets: 232 10*3/uL (ref 150.0–400.0)
RBC: 4.82 Mil/uL (ref 3.87–5.11)
RDW: 12.9 % (ref 11.5–15.5)
WBC: 6.3 10*3/uL (ref 4.0–10.5)

## 2022-06-18 LAB — HEMOGLOBIN A1C: Hgb A1c MFr Bld: 5.4 % (ref 4.6–6.5)

## 2022-06-18 LAB — TSH: TSH: 0.77 u[IU]/mL (ref 0.35–5.50)

## 2022-07-03 ENCOUNTER — Telehealth: Payer: Self-pay | Admitting: Nurse Practitioner

## 2022-07-03 NOTE — Telephone Encounter (Signed)
Patient advised copy of results is placed up front for pick up

## 2022-07-03 NOTE — Telephone Encounter (Signed)
Patient is requesting a copy of her lab work for 06/18/22. She needs it for a biometric screening from her job. She would like a phone call once its completed to pick up. [phone]

## 2023-01-27 ENCOUNTER — Ambulatory Visit: Payer: BC Managed Care – PPO | Admitting: Nurse Practitioner

## 2023-01-27 ENCOUNTER — Encounter: Payer: Self-pay | Admitting: Nurse Practitioner

## 2023-01-27 VITALS — BP 134/90 | HR 81 | Temp 97.4°F | Resp 16 | Ht 67.5 in | Wt 258.0 lb

## 2023-01-27 DIAGNOSIS — I10 Essential (primary) hypertension: Secondary | ICD-10-CM | POA: Diagnosis not present

## 2023-01-27 DIAGNOSIS — H9312 Tinnitus, left ear: Secondary | ICD-10-CM | POA: Diagnosis not present

## 2023-01-27 DIAGNOSIS — F411 Generalized anxiety disorder: Secondary | ICD-10-CM | POA: Diagnosis not present

## 2023-01-27 LAB — CBC
HCT: 42.1 % (ref 36.0–46.0)
Hemoglobin: 14.2 g/dL (ref 12.0–15.0)
MCHC: 33.7 g/dL (ref 30.0–36.0)
MCV: 85.8 fl (ref 78.0–100.0)
Platelets: 266 10*3/uL (ref 150.0–400.0)
RBC: 4.9 Mil/uL (ref 3.87–5.11)
RDW: 13.2 % (ref 11.5–15.5)
WBC: 5.9 10*3/uL (ref 4.0–10.5)

## 2023-01-27 LAB — COMPREHENSIVE METABOLIC PANEL
ALT: 12 U/L (ref 0–35)
AST: 15 U/L (ref 0–37)
Albumin: 3.8 g/dL (ref 3.5–5.2)
Alkaline Phosphatase: 63 U/L (ref 39–117)
BUN: 8 mg/dL (ref 6–23)
CO2: 30 mEq/L (ref 19–32)
Calcium: 9 mg/dL (ref 8.4–10.5)
Chloride: 103 mEq/L (ref 96–112)
Creatinine, Ser: 0.83 mg/dL (ref 0.40–1.20)
GFR: 84.17 mL/min (ref >60.00)
Glucose, Bld: 86 mg/dL (ref 70–99)
Potassium: 4.2 mEq/L (ref 3.5–5.1)
Sodium: 138 mEq/L (ref 135–145)
Total Bilirubin: 0.4 mg/dL (ref 0.2–1.2)
Total Protein: 6.7 g/dL (ref 6.0–8.3)

## 2023-01-27 LAB — TSH: TSH: 0.7 u[IU]/mL (ref 0.35–5.50)

## 2023-01-27 MED ORDER — LOSARTAN POTASSIUM 25 MG PO TABS: 25.0000 mg | ORAL_TABLET | Freq: Every day | ORAL | 0 refills | Status: DC

## 2023-01-27 MED ORDER — HYDROXYZINE HCL 10 MG PO TABS
10.0000 mg | ORAL_TABLET | Freq: Three times a day (TID) | ORAL | 0 refills | Status: DC | PRN
Start: 2023-01-27 — End: 2024-07-12

## 2023-01-27 NOTE — Patient Instructions (Signed)
Nice to see you today I will be in touch with the labs once I have them Follow up with me in 1 month, sooner if you need me 

## 2023-01-27 NOTE — Assessment & Plan Note (Signed)
Will do hydroxyzine 10 mg 3 times daily as needed sedation precautions reviewed.  Patient also consider therapy ambulatory referral placed today.

## 2023-01-27 NOTE — Progress Notes (Signed)
Established Patient Office Visit  Subjective   Patient ID: Jo Goodwin, female    DOB: [birth year]  Age: 47 y.o. MRN: [id number]  Chief Complaint  Patient presents with   Hypertension   Anxiety      HTN: patient does have a history of htn that is maintained on lifestyle modifications. States that she can check it at home. States that when she feels off she will check it. States that she checked it 146/107 and she continued to check it and it got to 138/90. States that she had a heaviness on her chest with heart racing. This has been going on fo rthe past week. States that she has had a role change and a lot of stress Role change was in February   Anxiety: states that after her role change she has some chest tightness and racing heart rate. States that she will take an allergy pill to help her sleep. States that she does have to use that infrequently.  States that the anxiety is intermittent versus all day every day.  States did a phone call from a coworker earlier and she had to tell herself and breathing deep to relax  Tinnutis: left sided that has been going on for consistently the past year.  No trauma that she is aware of.  States she can place her finger behind the ear and press and it seems to alleviate the symptoms some.  Has increased from intermittent to more constant as of late per patient report patient does have a headache but we think this is secondary to her hypertension     01/27/2023    9:30 AM 05/02/2021    9:08 AM 05/02/2021    8:08 AM  PHQ9 SCORE ONLY  PHQ-9 Total Score 2 1 0       01/27/2023    9:30 AM 05/02/2021    9:08 AM  GAD 7 : Generalized Anxiety Score  Nervous, Anxious, on Edge 1 0  Control/stop worrying 1 0  Worry too much - different things 1 0  Trouble relaxing 1 0  Restless 0 0  Easily annoyed or irritable 1 0  Afraid - awful might happen 1 0  Total GAD 7 Score 6 0  Anxiety Difficulty Somewhat difficult Not difficult at all        Review  of Systems  Constitutional:  Negative for chills and fever.  Respiratory:  Negative for shortness of breath.   Cardiovascular:  Negative for chest pain (tightness).  Neurological:  Positive for dizziness and headaches.  Psychiatric/Behavioral:  Negative for hallucinations and suicidal ideas.       Objective:     BP (!) 134/90   Pulse 81   Temp (!) 97.4 F (36.3 C)   Resp 16   Ht 5' 7.5" (1.715 m)   Wt 258 lb (117 kg)   LMP 11/02/2018 (Exact Date)   SpO2 100%   BMI 39.81 kg/m  BP Readings from Last 3 Encounters:  01/27/23 (!) 134/90  06/16/22 130/82  05/02/21 122/78   Wt Readings from Last 3 Encounters:  01/27/23 258 lb (117 kg)  06/16/22 256 lb 8 oz (116.3 kg)  05/02/21 260 lb (117.9 kg)      Physical Exam Vitals and nursing note reviewed.  Constitutional:      Appearance: Normal appearance.  HENT:     Right Ear: Tympanic membrane, ear canal and external ear normal.     Left Ear: Tympanic membrane, ear canal and external  ear normal.  Cardiovascular:     Rate and Rhythm: Normal rate and regular rhythm.     Heart sounds: Normal heart sounds.  Pulmonary:     Effort: Pulmonary effort is normal.     Breath sounds: Normal breath sounds.  Musculoskeletal:     Right lower leg: No edema.     Left lower leg: No edema.  Neurological:     Mental Status: She is alert.      No results found for any visits on 01/27/23.    The 10-year ASCVD risk score (Arnett DK, et al., 2019) is: 5.2%    Assessment & Plan:   Problem List Items Addressed This Visit       Cardiovascular and Mediastinum   HTN (hypertension)    Patient was diet controlled in the past prior to that she was on amlodipine which causes lower extremity edema so she self discontinued.  Will put patient on losartan 25 mg daily check blood pressure at home follow-up 1 month for blood pressure recheck along with BMP recheck.      Relevant Medications   losartan (COZAAR) 25 MG tablet   hydrOXYzine  (ATARAX) 10 MG tablet   Other Relevant Orders   CBC   Comprehensive metabolic panel   TSH     Other   GAD (generalized anxiety disorder) - Primary    Will do hydroxyzine 10 mg 3 times daily as needed sedation precautions reviewed.  Patient also consider therapy ambulatory referral placed today.      Relevant Medications   hydrOXYzine (ATARAX) 10 MG tablet   Other Relevant Orders   Ambulatory referral to Psychology   TSH   Tinnitus of left ear    Ear exam benign in office.  Continue to monitor can always refer to ENT if patient would like less attack to get blood pressure under control prior to making other treatment decisions       Return in about 4 weeks (around 02/24/2023) for BP recheck, GAD recheck .    Audria Nine, NP

## 2023-01-27 NOTE — Assessment & Plan Note (Signed)
Patient was diet controlled in the past prior to that she was on amlodipine which causes lower extremity edema so she self discontinued.  Will put patient on losartan 25 mg daily check blood pressure at home follow-up 1 month for blood pressure recheck along with BMP recheck.

## 2023-01-27 NOTE — Assessment & Plan Note (Signed)
Ear exam benign in office.  Continue to monitor can always refer to ENT if patient would like less attack to get blood pressure under control prior to making other treatment decisions

## 2023-02-12 DIAGNOSIS — I10 Essential (primary) hypertension: Secondary | ICD-10-CM | POA: Diagnosis not present

## 2023-02-12 DIAGNOSIS — T7840XA Allergy, unspecified, initial encounter: Secondary | ICD-10-CM | POA: Diagnosis not present

## 2023-03-02 ENCOUNTER — Ambulatory Visit: Payer: BC Managed Care – PPO | Admitting: Nurse Practitioner

## 2023-03-23 ENCOUNTER — Ambulatory Visit (INDEPENDENT_AMBULATORY_CARE_PROVIDER_SITE_OTHER): Payer: BC Managed Care – PPO | Admitting: Clinical

## 2023-03-23 DIAGNOSIS — F419 Anxiety disorder, unspecified: Secondary | ICD-10-CM

## 2023-03-23 DIAGNOSIS — F330 Major depressive disorder, recurrent, mild: Secondary | ICD-10-CM | POA: Diagnosis not present
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
## 2023-03-23 NOTE — Progress Notes (Signed)
                Joseguadalupe Stan, LCSW 

## 2023-04-06 DIAGNOSIS — I10 Essential (primary) hypertension: Secondary | ICD-10-CM | POA: Diagnosis not present

## 2023-04-06 DIAGNOSIS — T7840XD Allergy, unspecified, subsequent encounter: Secondary | ICD-10-CM | POA: Diagnosis not present

## 2023-04-24 ENCOUNTER — Other Ambulatory Visit: Payer: Self-pay | Admitting: Nurse Practitioner

## 2023-04-27 ENCOUNTER — Ambulatory Visit: Payer: BC Managed Care – PPO | Admitting: Clinical

## 2023-04-27 DIAGNOSIS — F330 Major depressive disorder, recurrent, mild: Secondary | ICD-10-CM | POA: Diagnosis not present

## 2023-04-27 DIAGNOSIS — F419 Anxiety disorder, unspecified: Secondary | ICD-10-CM | POA: Diagnosis not present

## 2023-04-27 NOTE — Progress Notes (Addendum)
[facility] Counselor/Therapist Progress Note  Patient ID: Jo Goodwin, MRN: [id number]    Date: 04/27/23  Time Spent: 9:34  am - 10:23 am : 49 Minutes  Treatment Type: Individual Therapy.  Reported Symptoms: Patient reported recent improvement in mood  Mental Status Exam: Appearance:  Neat and Well Groomed     Behavior: Appropriate  Motor: Normal  Speech/Language:  Clear and Coherent  Affect: Appropriate  Mood: normal  Thought process: normal  Thought content:   WNL  Sensory/Perceptual disturbances:   WNL  Orientation: oriented to person, place, and situation  Attention: Good  Concentration: Good  Memory: WNL  Fund of knowledge:  Good  Insight:   Good  Judgment:  Good  Impulse Control: Good   Risk Assessment: Danger to Self:  No Patient denied current suicidal ideation  Self-injurious Behavior: No Danger to Others: No Patient denied current homicidal ideation Duty to Warn:no Physical Aggression / Violence:No  Access to Firearms a concern: No  Gang Involvement:No   Subjective:  Patient stated, "probably about the same" in regards to stressors since last session. Patient stated, "I feel I'm getting better" as it relates to patient's mood since last session. Patient reported her husband has been trying to increase patient's involvement in activities. Patient reported difficulty maintaining ongoing self care. Patient reported she is on call for her job several times a quarter and patient reported being on call is a trigger for anxiety. Patient stated, "the anxiety I probably figured", "I think the major depression it kind of sits on me a little bit" in response to diagnoses. Patient stated, "I would prefer not to go that route" in response to recommendation for a consultation with a psychiatrist. Patient reported she is open to participation in therapy.   Interventions: Clinician conducted session in person at clinician's office at [facility].  Assessed the status of recent stressors and assessed patient's mood since last session. Clinician reviewed diagnoses and treatment recommendations. Provided psycho education related to diagnoses and treatment.   Collaboration of Care: Other not required at this time  Diagnosis:  Mild episode of recurrent major depressive disorder (HCC)  Anxiety disorder, unspecified type   Plan: Goals to be developed during follow up appointment on 06/03/23.          Doree Barthel, LCSW

## 2023-05-11 ENCOUNTER — Ambulatory Visit: Payer: BC Managed Care – PPO | Admitting: Clinical

## 2023-06-03 ENCOUNTER — Ambulatory Visit: Payer: BC Managed Care – PPO | Admitting: Clinical

## 2023-06-03 DIAGNOSIS — F330 Major depressive disorder, recurrent, mild: Secondary | ICD-10-CM

## 2023-06-03 DIAGNOSIS — F419 Anxiety disorder, unspecified: Secondary | ICD-10-CM

## 2023-06-03 NOTE — Progress Notes (Signed)
[facility] Counselor/Therapist Progress Note  Patient ID: Jo Goodwin, MRN: [id number]    Date: 06/03/23  Time Spent: 9:34  am - 10:26 am : 52 Minutes  Treatment Type: Individual Therapy.  Reported Symptoms: none reported  Mental Status Exam: Appearance:  Neat and Well Groomed     Behavior: Appropriate  Motor: Normal  Speech/Language:  Clear and Coherent  Affect: Appropriate  Mood: normal  Thought process: normal  Thought content:   WNL  Sensory/Perceptual disturbances:   WNL  Orientation: oriented to person, place, and situation  Attention: Good  Concentration: Good  Memory: WNL  Fund of knowledge:  Good  Insight:   Good  Judgment:  Good  Impulse Control: Good   Risk Assessment: Danger to Self:  No Patient denied current suicidal ideation  Self-injurious Behavior: No Danger to Others: No Patient denied current homicidal ideation Duty to Warn:no Physical Aggression / Violence:No  Access to Firearms a concern: No  Gang Involvement:No   Subjective:  Patient stated, "it's good" in response to events since last session. Patient stated, "I think I've been for the most part ok" in response to patient's mood since last session. Patient reported when she becomes frustrated or irritated patient retreats to herself. Patient reported she has been trying to participate in more activities outside of her home and stated, "I feel better in getting out and doing some things". 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 Patient stated, "I'm ok" in response to patient's current mood. Patient stated,  "trying to figure out how to better manage stressful situations" in response to goals for therapy. Patient stated, "I internalize a lot", "figure out how to not keep everything in so much" in response to goals for therapy. Patient stated, "how to see myself more positively and confidently" in response to goals for  therapy.   Interventions: Motivational Interviewing. Clinician conducted session in person at clinician's office at [facility]. Reviewed events since last session. Assessed patient's mood since last session and patient's current mood. Clinician utilized motivational interviewing to explore potential goals for therapy. Clinician utilized a task centered approach in collaboration with patient to develop goals for therapy. Patient participated in development of goals and agreed to goals for therapy.   Collaboration of Care: Other not required at this time  Diagnosis:  Mild episode of recurrent major depressive disorder (HCC)  Anxiety disorder, unspecified type   Plan: Patient is to utilize [employer], thought re-framing, healthy communication strategies, mindfulness and coping strategies to decrease symptoms associated with their diagnosis. Frequency: bi-weekly  Modality: individual     Long-term goal:   Identify, challenge, and replace negative core beliefs, thought patterns, and negative self talk that contribute to feelings of depression, anxiety, and decreased self esteem with positive thoughts, beliefs, and positive self talk per patient's report Target Date: 06/02/24  Progress: goal established 06/03/23   Short-term goal:  Identify, challenge, and replace unhelpful thinking patterns/cognitive distortions that contribute to barriers to patient participating in activities of enjoyment Target Date: 12/01/23  Progress: goal established 06/03/23   Implement mindfulness strategies to promote patient's focus on being in the present Target Date: 12/01/23  Progress: goal established 06/03/23   Develop and implement effective communication strategies for patient to utilize to increase patient's ability to express her thoughts and feelings to others to promote healthy communication with others Target Date: 12/01/23  Progress: goal established 06/03/23   Identify and  implement healthy  coping strategies for patient to utilize to decrease the impact of stress inducing situations Target Date: 12/01/23  Progress: goal established 06/03/23                      Doree Barthel, LCSW

## 2023-06-04 DIAGNOSIS — Z1231 Encounter for screening mammogram for malignant neoplasm of breast: Secondary | ICD-10-CM | POA: Diagnosis not present

## 2023-06-04 LAB — HM MAMMOGRAPHY

## 2023-06-08 ENCOUNTER — Encounter: Payer: Self-pay | Admitting: Nurse Practitioner

## 2023-06-10 DIAGNOSIS — R922 Inconclusive mammogram: Secondary | ICD-10-CM | POA: Diagnosis not present

## 2023-06-14 ENCOUNTER — Encounter: Payer: Self-pay | Admitting: Nurse Practitioner

## 2023-06-15 ENCOUNTER — Ambulatory Visit: Payer: BC Managed Care – PPO | Admitting: Clinical

## 2023-06-15 DIAGNOSIS — F419 Anxiety disorder, unspecified: Secondary | ICD-10-CM

## 2023-06-15 DIAGNOSIS — F330 Major depressive disorder, recurrent, mild: Secondary | ICD-10-CM | POA: Diagnosis not present

## 2023-06-15 NOTE — Progress Notes (Signed)
[facility] Counselor/Therapist Progress Note  Patient ID: Jo Goodwin, MRN: [id number],    Date: 06/15/2023  Time Spent: 2:34pm - 3:31pm : 57 minutes   Treatment Type: Individual Therapy  Reported Symptoms: none reported   Mental Status Exam: Appearance:  Neat and Well Groomed     Behavior: Appropriate  Motor: Normal  Speech/Language:  Clear and Coherent  Affect: Appropriate  Mood: normal  Thought process: normal  Thought content:   WNL  Sensory/Perceptual disturbances:   WNL  Orientation: oriented to person, place, and situation  Attention: Good  Concentration: Good  Memory: WNL  Fund of knowledge:  Good  Insight:   Good  Judgment:  Good  Impulse Control: Good   Risk Assessment: Danger to Self:  No Patient denied current suicidal ideation  Self-injurious Behavior: No Danger to Others: No Patient denied current homicidal ideation Duty to Warn:no Physical Aggression / Violence:No  Access to Firearms a concern: No  Gang Involvement:No   Subjective: Patient stated, "pretty good" in response to events and patient's mood since last session. Patient stated, "if there is a change it's because I'm making a change" in response to clinician's inquiry regarding changes since last session. Patient reported she is being more intentional about being present when patient is at home, refraining from opening patient's computer when at home,  refraining from looking at patient's phone when at home, and establishing boundaries with staff regarding communication outside of work hours. Patient reported she feels the changes patient has implemented has been helpful. Patient reported her supervisor at work is also a member of patient's family. Patient reported she is trying to separate work/personal conversations when communicating with supervisor and reported feeling tension since patient has changed her approach when communication with supervisor. Patient reported she had a  personal conversation with her supervisor/family member and reported she feels the information disclosed in the conversation was shared with her supervisor's supervisor. Patient reported as a result she is experiencing difficulty trusting her supervisor/family member as it relates to work matters. Patient reported the relationship with her supervisor/family member is a current stressor. Patient reported changes in patient's health is an additional stressor. Patient identified sleeping, watching television, and trying not to think about the stressor are current strategies patient utilizes to manage stressors. During today's session, patient shared patient's thought record entries.     Interventions: Cognitive Behavioral Therapy and Motivational Interviewing. Clinician conducted session in person at clinician's office at [facility]. Assessed patient's mood since last session and patient's current mood. Explored changes patient has implemented to increase patient being present when patient is at home and discussed the outcome. Discussed patient's relationship with patient's supervisor/family member and assisted patient in discussing and identifying thoughts/feelings triggered by interactions with patient's supervisor/family member. Provided psycho education related to the importance of communication in relationships and establishing/maintaining boundaries. Explored and identified additional stressors. Reviewed patient's thought record and provided psycho education related to use of thought record. Clinician requested for homework patient continue thought record.    Collaboration of Care: Other not required at this time   Diagnosis:  Mild episode of recurrent major depressive disorder (HCC)   Anxiety disorder, unspecified type     Plan: Patient is to utilize [employer], thought re-framing, healthy communication strategies, mindfulness and coping strategies to decrease symptoms  associated with their diagnosis. Frequency: bi-weekly  Modality: individual      Long-term goal:   Identify, challenge, and replace negative core beliefs, thought patterns, and  negative self talk that contribute to feelings of depression, anxiety, and decreased self esteem with positive thoughts, beliefs, and positive self talk per patient's report Target Date: 06/02/24  Progress: progressing    Short-term goal:  Identify, challenge, and replace unhelpful thinking patterns/cognitive distortions that contribute to barriers to patient participating in activities of enjoyment Target Date: 12/01/23  Progress: progressing    Implement mindfulness strategies to promote patient's focus on being in the present Target Date: 12/01/23  Progress: progressing    Develop and implement effective communication strategies for patient to utilize to increase patient's ability to express her thoughts and feelings to others to promote healthy communication with others Target Date: 12/01/23  Progress: progressing    Identify and implement healthy coping strategies for patient to utilize to decrease the impact of stress inducing situations Target Date: 12/01/23  Progress: progressing                  Doree Barthel, LCSW

## 2023-06-15 NOTE — Progress Notes (Signed)
                Dezi Schaner, LCSW 

## 2023-07-01 ENCOUNTER — Ambulatory Visit: Payer: BC Managed Care – PPO | Admitting: Clinical

## 2023-07-13 ENCOUNTER — Ambulatory Visit: Payer: BC Managed Care – PPO | Admitting: Clinical

## 2023-07-22 ENCOUNTER — Ambulatory Visit: Payer: BC Managed Care – PPO | Admitting: Clinical

## 2023-07-22 DIAGNOSIS — F330 Major depressive disorder, recurrent, mild: Secondary | ICD-10-CM | POA: Diagnosis not present

## 2023-07-22 DIAGNOSIS — F419 Anxiety disorder, unspecified: Secondary | ICD-10-CM

## 2023-07-22 NOTE — Progress Notes (Signed)
                Dezi Schaner, LCSW 

## 2023-07-22 NOTE — Progress Notes (Signed)
[facility] Counselor/Therapist Progress Note  Patient ID: Jo Goodwin, MRN: [id number],    Date: 07/22/2023  Time Spent: 8:34am - 9:33am : 59 minutes   Treatment Type: Individual Therapy  Reported Symptoms: none reported  Mental Status Exam: Appearance:  Neat and Well Groomed     Behavior: Appropriate  Motor: Normal  Speech/Language:  Clear and Coherent  Affect: Appropriate  Mood: normal  Thought process: normal  Thought content:   WNL  Sensory/Perceptual disturbances:   WNL  Orientation: oriented to person, place, and situation  Attention: Good  Concentration: Good  Memory: WNL  Fund of knowledge:  Good  Insight:   Good  Judgment:  Good  Impulse Control: Good   Risk Assessment: Danger to Self:  No Patient denied current suicidal ideation  Self-injurious Behavior: No Danger to Others: No Patient denied current homicidal ideation Duty to Warn:no Physical Aggression / Violence:No  Access to Firearms a concern: No  Gang Involvement:No   Subjective: Patient stated, "I've had a rough week" and reported stress related to the dynamics of husband's family. Patient reported she experiences difficulty establishing herself "as a wife and my place within the family". Patient reported husband's family's dynamics have started affecting patient and patient's relationship with her husband. Patient reported a recent conflict with her husband regarding a situation involving husband's family. Patient stated, "I just shut down" and reported minimal conversation with husband for several days in response. Patient reported she thought, "this is the rest of my life" in regard to husband's role within his family. Patient stated, "I'm just frustrated with this situation" and stated,  "I don't know what would change". Patient stated, "I tried to focus on the way I felt and the way I saw the situation" when communicating with husband. Patient reported she did not feel she was being  heard during conversation with husband. Patient stated, "I don't know how to solve this".  Patient reported her husband feels patient is attacking his family when she expresses her thoughts/feelings. Patient stated, "It would benefit [place]" in response to couples counseling. Patient stated,  "I'm always trying to figure out where I fit in" in reference to husband's family. Patient stated, "Lavenia Atlas been feeling good" in response to patient's mood. Patient reported she is not happy with herself and stated,"the follow through is part of my struggle" in reference to patient changing her behaviors.   Interventions: Cognitive Behavioral Therapy and Interpersonal. Clinician conducted session in person at clinician's office at [facility]. Reviewed events since last session. Assisted patient in discussing current stressors. Assisted patient in exploring and identifying thoughts/feelings triggered by recent conflict with husband and husband's family's dynamics. Explored patient's communication with husband when expressing her thoughts/feelings. Provided psycho education related to the use of "I" statements, stone walling and taking a time out. Clinician recommended couples counseling and discussed recommendation with patient. Provided psycho education related to depressive symptoms. Clinician requested for homework patient continue thought record.     Collaboration of Care: Other not required at this time   Diagnosis:  Mild episode of recurrent major depressive disorder (HCC)   Anxiety disorder, unspecified type     Plan: Patient is to utilize [employer], thought re-framing, healthy communication strategies, mindfulness and coping strategies to decrease symptoms associated with their diagnosis. Frequency: bi-weekly  Modality: individual      Long-term goal:   Identify, challenge, and replace negative core beliefs, thought patterns, and negative self talk that contribute to feelings of  depression, anxiety, and decreased self esteem with positive thoughts, beliefs, and positive self talk per patient's report Target Date: 06/02/24  Progress: progressing    Short-term goal:  Identify, challenge, and replace unhelpful thinking patterns/cognitive distortions that contribute to barriers to patient participating in activities of enjoyment Target Date: 12/01/23  Progress: progressing    Implement mindfulness strategies to promote patient's focus on being in the present Target Date: 12/01/23  Progress: progressing    Develop and implement effective communication strategies for patient to utilize to increase patient's ability to express her thoughts and feelings to others to promote healthy communication with others Target Date: 12/01/23  Progress: progressing    Identify and implement healthy coping strategies for patient to utilize to decrease the impact of stress inducing situations Target Date: 12/01/23  Progress: progressing                Doree Barthel, LCSW

## 2023-08-05 ENCOUNTER — Ambulatory Visit: Payer: BC Managed Care – PPO | Admitting: Clinical

## 2023-08-05 DIAGNOSIS — F419 Anxiety disorder, unspecified: Secondary | ICD-10-CM

## 2023-08-05 DIAGNOSIS — F330 Major depressive disorder, recurrent, mild: Secondary | ICD-10-CM | POA: Diagnosis not present

## 2023-08-05 NOTE — Progress Notes (Signed)
[facility] Counselor/Therapist Progress Note  Patient ID: Jo Goodwin, MRN: [id number],    Date: 08/05/2023  Time Spent: 10:33am - 11:26am : 53 minutes   Treatment Type: Individual Therapy  Reported Symptoms: loss of interest  Mental Status Exam: Appearance:  Neat and Well Groomed     Behavior: Appropriate  Motor: Normal  Speech/Language:  Clear and Coherent  Affect: Appropriate  Mood: normal  Thought process: normal  Thought content:   WNL  Sensory/Perceptual disturbances:   WNL  Orientation: oriented to person, place, and situation  Attention: Good  Concentration: Good  Memory: WNL  Fund of knowledge:  Good  Insight:   Good  Judgment:  Good  Impulse Control: Good   Risk Assessment: Danger to Self:  No Patient denied current suicidal ideation  Self-injurious Behavior: No Danger to Others: No Patient denied current homicidal ideation Duty to Warn:no Physical Aggression / Violence:No  Access to Firearms a concern: No  Gang Involvement:No   Subjective: Patient stated, "going ok" in response to events since last session. Patient stated, "progress has been made" in response to recent conflict with husband. Patient stated, "trying to put myself in his (husband's) shoes a little bit more", "I just feel defeated". Patient reported her husband is willing to participate in couples counseling. Patient stated,  "Jo Goodwin been pretty good" in response to patient's mood since last session. Patient reported 2-3 times since last session patient preferred to spend time by herself and refrained from participating in activities with husband. Patient reported work related stress, family related stressors, and stated, "I just wanted to rest" as barriers to participation in recent activities. Patient stated, "I can't get out of it, I don't know how to get out of it" in response to feelings related to recent conflict with husband. Patient stated, "him (husband) and I are family", "I  would rather have more boundaries" in response to patient's "ideal" family dynamic. Patient stated, "I am fine spending time with everybody from both sides", "I just want everybody to deal with their own stuff, I don't want to take on everybody's burden". Patient stated,  "every time something goes on they (husband's family) call him".  Patient stated, "I wish they were more independent". Patient reported a history of conflict with husband's family members that have never been resolved.   Interventions: Cognitive Behavioral Therapy and Interpersonal. Clinician conducted session in person at clinician's office at [facility]. Reviewed events since last session. Discussed the status of recent conflict with husband. Assisted patient in exploring and identifying additional thoughts/feelings triggered by recent conflict with husband and husband's family's dynamics. Reviewed recommendation for couples counseling. Assessed patient's mood since last session and assessed current mood. Explored barriers to patient participating in activities.  Explored patient's perspective of the "ideal" family dynamic and explored alternative perspectives. Clinician requested for homework patient write a letter to husband's family.   Collaboration of Care: Other not required at this time   Diagnosis:  Mild episode of recurrent major depressive disorder (HCC)   Anxiety disorder, unspecified type     Plan: Patient is to utilize [employer], thought re-framing, healthy communication strategies, mindfulness and coping strategies to decrease symptoms associated with their diagnosis. Frequency: bi-weekly  Modality: individual      Long-term goal:   Identify, challenge, and replace negative core beliefs, thought patterns, and negative self talk that contribute to feelings of depression, anxiety, and decreased self esteem with positive thoughts, beliefs, and positive self talk per patient's  report Target  Date: 06/02/24  Progress: progressing    Short-term goal:  Identify, challenge, and replace unhelpful thinking patterns/cognitive distortions that contribute to barriers to patient participating in activities of enjoyment Target Date: 12/01/23  Progress: progressing    Implement mindfulness strategies to promote patient's focus on being in the present Target Date: 12/01/23  Progress: progressing    Develop and implement effective communication strategies for patient to utilize to increase patient's ability to express her thoughts and feelings to others to promote healthy communication with others Target Date: 12/01/23  Progress: progressing    Identify and implement healthy coping strategies for patient to utilize to decrease the impact of stress inducing situations Target Date: 12/01/23  Progress: progressing            Doree Barthel, LCSW

## 2023-08-05 NOTE — Progress Notes (Signed)
                Dezi Schaner, LCSW 

## 2023-08-24 ENCOUNTER — Ambulatory Visit: Payer: BC Managed Care – PPO | Admitting: Clinical

## 2023-08-24 DIAGNOSIS — F419 Anxiety disorder, unspecified: Secondary | ICD-10-CM

## 2023-08-24 DIAGNOSIS — F330 Major depressive disorder, recurrent, mild: Secondary | ICD-10-CM

## 2023-08-24 NOTE — Progress Notes (Signed)
                Dezi Schaner, LCSW 

## 2023-08-24 NOTE — Progress Notes (Signed)
[facility] Counselor/Therapist Progress Note  Patient ID: Jo Goodwin, MRN: [id number],    Date: 08/24/2023  Time Spent: 8:36am - 9:33am : 57 minutes  Treatment Type: Individual Therapy  Reported Symptoms: fluctuations in mood  Mental Status Exam: Appearance:  Neat and Well Groomed     Behavior: Appropriate  Motor: Normal  Speech/Language:  Clear and Coherent  Affect: Tearful when discussing letter to family  Mood: Patient stated, "I'm good today"  Thought process: normal  Thought content:   WNL  Sensory/Perceptual disturbances:   WNL  Orientation: oriented to person, place, and situation  Attention: Good  Concentration: Good  Memory: WNL  Fund of knowledge:  Good  Insight:   Good  Judgment:  Good  Impulse Control: Good   Risk Assessment: Danger to Self:  No Patient denied current suicidal ideation  Self-injurious Behavior: No Danger to Others: No Patient denied current homicidal ideation Duty to Warn:no Physical Aggression / Violence:No  Access to Firearms a concern: No  Gang Involvement:No   Subjective: Patient stated, "good, ok" in response to events since last session. Patient stated, "holiday was good, there was a little tension". Patient reported she was frustrated prior leaving for the beach and stated, "because it was with my family he (husband) wasn't as pressed to get off on time". Patient stated, "I think it brought tension to [place] (patient/husband)". Patient reported during the trip her husband spent a significant amount of time on his phone talking to his family and patient stated, "it felt like he was a little distant". Patient reported family dynamics are "causing more tension with [place] (patient/husband)". Patient stated, "I do think it would be helpful" in response to couples counseling. Patient stated, "it (family dynamics) is a consistent stressor". Patient reported patient/husband recently had a conversation about family dynamics and husband  became defensive in response. Patient stated, "it puts you (patient) in a place of now I don't want to say anything" in reference to recent conversation. Patient stated, "I'm always on edge, I don't know what to say, how to say it". Patient stated, "I started", "I did not get far" in reference to homework assignment and became tearful during session when discussing homework assignment. Patient identified "the hurt" as a trigger for tearfulness. Patient reported she feels she needs to write a letter to her husband as well. Patient stated, "I think its the stuffing of emotions" in response to tearfulness. Patient stated, "probably not great", "I think it has made [place] both distant with each other", "its made me emotional" in reference to family dynamics. Patient reported a recent conversation with her husband "brought up old stuff from when we were dating and he wasn't be truthful and honest" and reported various situations trigger the thought. Patient stated, "I want to feel like the priority and I don't" in reference to relationship with husband. Patient stated, "I probably feel least like myself", "I don't like it". Patient stated, "I'm good today", "a lot of thoughts" in response to patient's current mood. Patient stated, "its been up and down" in response to mood since last session.   Interventions: Cognitive Behavioral Therapy and Interpersonal. Clinician conducted session in person at clinician's office at [facility]. Reviewed events since last session. Assisted patient in exploring and identifying thoughts/feelings triggered by husband's response to recent family trip and recent conversation with husband. Reviewed recommendation for couples counseling. Assisted patient in examining the impact of family dynamics on patient's mood, behaviors, and relationship with husband. Reviewed  patient's homework. Assisted patient in examining patient's emotional response when discussing homework assignment.  Assessed patient's mood since last session and current mood. Clinician requested for homework patient write a letter to husband's family and write a description of patient's perception of "feeling like myself".    Collaboration of Care: Other not required at this time   Diagnosis:  Mild episode of recurrent major depressive disorder (HCC)   Anxiety disorder, unspecified type     Plan: Patient is to utilize [employer], thought re-framing, healthy communication strategies, mindfulness and coping strategies to decrease symptoms associated with their diagnosis. Frequency: bi-weekly  Modality: individual      Long-term goal:   Identify, challenge, and replace negative core beliefs, thought patterns, and negative self talk that contribute to feelings of depression, anxiety, and decreased self esteem with positive thoughts, beliefs, and positive self talk per patient's report Target Date: 06/02/24  Progress: progressing    Short-term goal:  Identify, challenge, and replace unhelpful thinking patterns/cognitive distortions that contribute to barriers to patient participating in activities of enjoyment Target Date: 12/01/23  Progress: progressing    Implement mindfulness strategies to promote patient's focus on being in the present Target Date: 12/01/23  Progress: progressing    Develop and implement effective communication strategies for patient to utilize to increase patient's ability to express her thoughts and feelings to others to promote healthy communication with others Target Date: 12/01/23  Progress: progressing    Identify and implement healthy coping strategies for patient to utilize to decrease the impact of stress inducing situations Target Date: 12/01/23  Progress: progressing       Doree Barthel, LCSW

## 2023-09-14 ENCOUNTER — Ambulatory Visit: Payer: BC Managed Care – PPO | Admitting: Clinical

## 2023-09-14 DIAGNOSIS — F330 Major depressive disorder, recurrent, mild: Secondary | ICD-10-CM

## 2023-09-14 DIAGNOSIS — F419 Anxiety disorder, unspecified: Secondary | ICD-10-CM

## 2023-09-14 NOTE — Progress Notes (Signed)
   Doree Barthel, LCSW

## 2023-09-14 NOTE — Progress Notes (Signed)
 [facility] Counselor/Therapist Progress Note  Patient ID: Jo Goodwin, MRN: [id number],    Date: 09/14/2023  Time Spent: 10:38am - 11:40am : 62 minutes   Treatment Type: Individual Therapy  Reported Symptoms: none reported  Mental Status Exam: Appearance:  Neat and Well Groomed     Behavior: Appropriate  Motor: Normal  Speech/Language:  Clear and Coherent  Affect: Appropriate  Mood: normal  Thought process: normal  Thought content:   WNL  Sensory/Perceptual disturbances:   WNL  Orientation: oriented to person, place, and situation  Attention: Good  Concentration: Good  Memory: WNL  Fund of knowledge:  Good  Insight:   Good  Judgment:  Good  Impulse Control: Good   Risk Assessment:  Danger to Self:  No Patient denied current suicidal ideation  Self-injurious Behavior: No Danger to Others: No Patient denied current homicidal ideation Duty to Warn:no Physical Aggression / Violence:No  Access to Firearms a concern: No  Gang Involvement:No   Subjective: Patient stated, the holidays were ok, ups and downs definitely, just not allowing anything to get to me.  Patient reported patient's family hosts a holiday breakfast. Patient reported patient's husband's family initially indicated they were not attending the breakfast and notified patient's husband the day prior to the breakfast that they would be attending. Patient stated, for the most part it was ok in reference to the breakfast. Patient reported she attempted to write a letter to her husband's family. Patient stated, it did not feel like I was writing from my heart, I was writing from my head and stated, I got frustrated with myself. Patient stated, Im writing and it doesn't feel natural to me. Patient reported she feels she is now more expressive than she has been in the past. Patient reported patient's professional boundaries can be a barrier to patient expressing her thoughts/feelings at times  and those boundaries can be rigid. Patient reported she does not maintain frequent communication with patient's side of the family and reported she does not feel comfortable communicating with others daily. Patient reported she has recognized her husband's communication with his family is a barrier to patient/husband establishing and maintaining boundaries with family members. Patient reported her husband is open to participation in couples counseling but would like to start with individual therapy first. Patient reported the last time she felt like herself patient felt in control and confident. Patient reported she currently feels out of control. Patient stated, being intentional about how I feel about me in reference to patient's perception of feeling like myself. Patient stated, I'm good today in response to patient's mood.   Interventions: Cognitive Behavioral Therapy and Interpersonal. Clinician conducted session in person at clinician's office at [facility]. Reviewed events since last session and discussed patient's response to those events. Reviewed patient's homework to write a letter and explored barriers to patient verbalizing her thoughts/feelings. Examined differences between family dynamics and communication as it relates to patient's family and patient's husband's family. Challenged statements/thoughts and assisted patient in reframing situations with husband's family. Discussed establishing boundaries with husband's family members and explored barriers to establishing boundaries. Reviewed recommendation for couples counseling. Discussed patient's perception of feeling like myself. Assessed patient's mood. Clinician requested for homework patient write a letter to husband's family.   Collaboration of Care: Other not required at this time   Diagnosis:  Mild episode of recurrent major depressive disorder (HCC)   Anxiety disorder, unspecified type     Plan: Patient is to  utilize  Cognitive Behavioral Therapy, thought re-framing, healthy communication strategies, mindfulness and coping strategies to decrease symptoms associated with their diagnosis. Frequency: bi-weekly  Modality: individual      Long-term goal:   Identify, challenge, and replace negative core beliefs, thought patterns, and negative self talk that contribute to feelings of depression, anxiety, and decreased self esteem with positive thoughts, beliefs, and positive self talk per patient's report Target Date: 06/02/24  Progress: progressing    Short-term goal:  Identify, challenge, and replace unhelpful thinking patterns/cognitive distortions that contribute to barriers to patient participating in activities of enjoyment Target Date: 12/01/23  Progress: progressing    Implement mindfulness strategies to promote patient's focus on being in the present Target Date: 12/01/23  Progress: progressing    Develop and implement effective communication strategies for patient to utilize to increase patient's ability to express her thoughts and feelings to others to promote healthy communication with others Target Date: 12/01/23  Progress: progressing    Identify and implement healthy coping strategies for patient to utilize to decrease the impact of stress inducing situations Target Date: 12/01/23  Progress: progressing     Darice Seats, LCSW

## 2023-09-28 ENCOUNTER — Ambulatory Visit: Payer: BC Managed Care – PPO | Admitting: Clinical

## 2023-10-05 ENCOUNTER — Ambulatory Visit: Payer: BC Managed Care – PPO | Admitting: Clinical

## 2023-10-14 ENCOUNTER — Ambulatory Visit: Payer: BC Managed Care – PPO | Admitting: Clinical

## 2023-10-14 DIAGNOSIS — F419 Anxiety disorder, unspecified: Secondary | ICD-10-CM

## 2023-10-14 DIAGNOSIS — F330 Major depressive disorder, recurrent, mild: Secondary | ICD-10-CM

## 2023-10-14 NOTE — Progress Notes (Signed)
[facility] Counselor/Therapist Progress Note  Patient ID: Jo Goodwin, MRN: [id number],    Date: 10/14/2023  Time Spent: 10:38am - 11:36am : 58 minutes   Treatment Type: Individual Therapy  Reported Symptoms: none reported  Mental Status Exam: Appearance:  Neat and Well Groomed     Behavior: Appropriate  Motor: Normal  Speech/Language:  Clear and Coherent and Normal Rate  Affect: Appropriate  Mood: normal  Thought process: normal  Thought content:   WNL  Sensory/Perceptual disturbances:   WNL  Orientation: oriented to person, place, and situation  Attention: Good  Concentration: Good  Memory: WNL  Fund of knowledge:  Good  Insight:   Good  Judgment:  Good  Impulse Control: Good   Risk Assessment: Danger to Self:  No Patient denied current suicidal ideation  Self-injurious Behavior: No Danger to Others: No Patient denied current homicidal ideation Duty to Warn:no Physical Aggression / Violence:No  Access to Firearms a concern: No  Gang Involvement:No   Subjective: Patient stated, "it's been good" in response to events since last session. Patient reported she does not like cold weather and reported recent weather "it kind of put me in a place of staying home". Patient stated, "I think it's been good for the most part" in response to patient's mood since last session. Patient stated, "I'm much better today" in response to patient's current mood. Patient stated, "the letter has not come along", "I just have not gotten in a place where I feel able to fully write". Patient stated, "I don't know what the block is there", "probably me liking to have control over everything" in response to barriers to writing letter. Patient stated, "I know that the situation is not going to change", "I think it overwhelms me a little bit" in response to writing the letter. Patient reported her husband "takes things at face value", "it frustrates me".  Patient reported feeling  irritated when husband does not put himself first. Patient reported "she (mother in law) has made him feel like it's your (husband) responsibility to take care of everything". Patient stated, "it irritates me because I deal with what's more of his (husband) real thoughts" after assisting family. Patient reported when patient/husband make a decision about an issue husband will discuss the issue with others and change his decision. Patient stated, "then I feel like I can't rely on you (husband) to stick to it and more forward". Patient reported she feels "it (decision) will change next week and I can't fully invest". Patient reported "a lack of trust there" and stated, "Im realizing that now than I ever did before" in reference to relationship with husband. Patient reported she feels there is a lack of privacy and lack of advocacy for patient in their marriage. Patient reported she feels her feelings are dismissed when discussing her feelings with her husband. Patient stated, "my way of boundaries is I just don't deal with it". Patient stated, "I know it would make a lot for me" in reference to impact of husband maintaining privacy, advocacy, and respecting patient's boundaries. Patient stated, "I'm going to have to get this (feelings) out and there's going to have to be some change somewhere" in response to couples counseling.   Interventions: Cognitive Behavioral Therapy and Interpersonal. Clinician conducted session in person at clinician's office at [facility]. Reviewed events since last session. Assessed patient's mood since last session and current mood. Reviewed patient's homework. Explored barriers to completing homework assignment. Assisted patient in exploring, identifying,  and practicing verbally expressing patient's thoughts/feelings as it relates to family members, family dynamics, and patient's relationship with her husband. Discussed patient's ability to set boundaries and maintain  boundaries. Reviewed recommendation for couples counseling. Clinician requested for homework patient complete a thought record.    Collaboration of Care: Other not required at this time   Diagnosis:  Mild episode of recurrent major depressive disorder (HCC)   Anxiety disorder, unspecified type     Plan: Patient is to utilize [employer], thought re-framing, healthy communication strategies, mindfulness and coping strategies to decrease symptoms associated with their diagnosis. Frequency: bi-weekly  Modality: individual      Long-term goal:   Identify, challenge, and replace negative core beliefs, thought patterns, and negative self talk that contribute to feelings of depression, anxiety, and decreased self esteem with positive thoughts, beliefs, and positive self talk per patient's report Target Date: 06/02/24  Progress: progressing    Short-term goal:  Identify, challenge, and replace unhelpful thinking patterns/cognitive distortions that contribute to barriers to patient participating in activities of enjoyment Target Date: 12/01/23  Progress: progressing    Implement mindfulness strategies to promote patient's focus on being in the present Target Date: 12/01/23  Progress: progressing    Develop and implement effective communication strategies for patient to utilize to increase patient's ability to express her thoughts and feelings to others to promote healthy communication with others Target Date: 12/01/23  Progress: progressing    Identify and implement healthy coping strategies for patient to utilize to decrease the impact of stress inducing situations Target Date: 12/01/23  Progress: progressing     Doree Barthel, LCSW

## 2023-10-14 NOTE — Progress Notes (Signed)
Doree Barthel, LCSW

## 2023-10-19 ENCOUNTER — Ambulatory Visit: Payer: BC Managed Care – PPO | Admitting: Clinical

## 2023-10-19 DIAGNOSIS — F330 Major depressive disorder, recurrent, mild: Secondary | ICD-10-CM

## 2023-10-19 DIAGNOSIS — F419 Anxiety disorder, unspecified: Secondary | ICD-10-CM

## 2023-10-19 NOTE — Progress Notes (Signed)
   Jo Barthel, LCSW

## 2023-10-19 NOTE — Progress Notes (Signed)
 [facility] Counselor/Therapist Progress Note  Patient ID: GENESEE NASE, MRN: [id number],    Date: 10/19/2023  Time Spent: 2:33pm - 3:38pm : 65 minutes   Treatment Type: Individual Therapy  Reported Symptoms: none reported  Mental Status Exam: Appearance:  Neat and Well Groomed     Behavior: Appropriate  Motor: Normal  Speech/Language:  Clear and Coherent and Normal Rate  Affect: Appropriate  Mood: normal  Thought process: normal  Thought content:   WNL  Sensory/Perceptual disturbances:   WNL  Orientation: oriented to person, place, and situation  Attention: Good  Concentration: Good  Memory: WNL  Fund of knowledge:  Good  Insight:   Good  Judgment:  Good  Impulse Control: Good   Risk Assessment: Danger to Self:  No Patient denied current suicidal ideation  Self-injurious Behavior: No Danger to Others: No Patient denied current homicidal ideation Duty to Warn:no Physical Aggression / Violence:No  Access to Firearms a concern: No  Gang Involvement:No   Subjective: Patient stated, for the most part pretty good, a little bit (work related stressors) here and there in response to events since last session. Patient stated, I think I've been good overall in response to patient's mood since last session. Patient reported she has developed hives and has a history of hives when experiencing stress. Patient reported shutting down during communication and stated, if I'm over it, if I'm tired. Patient stated, It depends on how long your talking or I check out in reference to patient's focus during conversations. Patient stated, In my mind I already know where this is going and in my mind I've checked out in reference to conversations with husband. Patient stated, I think they're probably never fully resolved in reference to conversations with husband. Patient stated, I know that irritates him (husband) because I will shut down a conversation real quick.  Patient stated, I can always tell when I'm boiling in reference to patient's emotions during conversations with husband. Patient stated, I do think about where he (husband) is and how he receives things, my struggle is thinking that I'm not right about the situation. Patient stated, I don't feel like there's anyone I can fully be free with in reference to patient communicating her thoughts and feelings to others. Patient reported she feels she has never fully opened up to others. Patient reported she did not feel she could express her thoughts/feelings as a child. Patient reported observing her parents' relationship impacted patient's core beliefs. Patient stated, seeing things that I thought I could fix in reference to patient's marriage and reported feeling frustrated in response. Patient reported patient and patient's son have had to work on their communication throughout the years. Patient stated, I learned early on I didn't trust people.    Interventions: Cognitive Behavioral Therapy. Clinician conducted session in person at clinician's office at [facility]. Reviewed events since last session. Discussed referral for couples counseling. Discussed work related stress and the physical impact of stress on patient. Assisted patient in exploring patient's strengths and challenges as it relates to communication.  Assisted patient in exploring and identifying barriers to communication and vulnerability in relationships. Explored patient's core beliefs and the impact on patient's communication and relationships. Reviewed patient's thought record. Clinician requested for homework patient continue thought record.    Collaboration of Care: Other not required at this time   Diagnosis:  Mild episode of recurrent major depressive disorder (HCC)   Anxiety disorder, unspecified type     Plan: Patient  is to utilize [employer], thought re-framing, healthy communication  strategies, mindfulness and coping strategies to decrease symptoms associated with their diagnosis. Frequency: bi-weekly  Modality: individual      Long-term goal:   Identify, challenge, and replace negative core beliefs, thought patterns, and negative self talk that contribute to feelings of depression, anxiety, and decreased self esteem with positive thoughts, beliefs, and positive self talk per patient's report Target Date: 06/02/24  Progress: progressing    Short-term goal:  Identify, challenge, and replace unhelpful thinking patterns/cognitive distortions that contribute to barriers to patient participating in activities of enjoyment Target Date: 12/01/23  Progress: progressing    Implement mindfulness strategies to promote patient's focus on being in the present Target Date: 12/01/23  Progress: progressing    Develop and implement effective communication strategies for patient to utilize to increase patient's ability to express her thoughts and feelings to others to promote healthy communication with others Target Date: 12/01/23  Progress: progressing    Identify and implement healthy coping strategies for patient to utilize to decrease the impact of stress inducing situations Target Date: 12/01/23  Progress: progressing    Darice Seats, LCSW

## 2023-11-04 ENCOUNTER — Ambulatory Visit: Payer: BC Managed Care – PPO | Admitting: Clinical

## 2023-11-04 DIAGNOSIS — F419 Anxiety disorder, unspecified: Secondary | ICD-10-CM | POA: Diagnosis not present

## 2023-11-04 DIAGNOSIS — F330 Major depressive disorder, recurrent, mild: Secondary | ICD-10-CM

## 2023-11-04 NOTE — Progress Notes (Unsigned)
   Doree Barthel, LCSW

## 2023-11-04 NOTE — Progress Notes (Unsigned)
 [facility] Counselor/Therapist Progress Note  Patient ID: Jo Goodwin, MRN: [id number],    Date: 11/04/2023  Time Spent: 2:31pm - 3:25pm : 54 minutes   Treatment Type: Individual Therapy  Reported Symptoms: Patient reported recent fluctuation in mood  Mental Status Exam: Appearance:  Neat and Well Groomed     Behavior: Appropriate  Motor: Normal  Speech/Language:  Clear and Coherent and Normal Rate  Affect: Appropriate  Mood: normal  Thought process: normal  Thought content:   WNL  Sensory/Perceptual disturbances:   WNL  Orientation: oriented to person, place, and situation  Attention: Good  Concentration: Good  Memory: WNL  Fund of knowledge:  Good  Insight:   Good  Judgment:  Good  Impulse Control: Good   Risk Assessment: Danger to Self:  No Patient denied current suicidal ideation  Self-injurious Behavior: No Danger to Others: No Patient denied current homicidal ideation Duty to Warn:no Physical Aggression / Violence:No  Access to Firearms a concern: No  Gang Involvement:No   Subjective: Patient stated, "kind of up and down" in response to events since last session. Patient stated, "I've had some ok days and good days".  Patient stated, "I'm trying to stay in a positive attitude about the ok days". Patient reported "some intense conversations over the past week or two" between patient and husband. Patient stated, "I think what I am starting to realize is I don't know what resolved is". Patient stated, "I've kind of taken on a thought process of if there will ever be one" in reference to resolutions to conversations with husband. Patient stated, "I don't know what could fix this" in reference to recent conversations with husband. Patient reported her husband stated he feels patient has caused "discord" with husband's family and patient stated, "it was very hurtful" in reference to husband's response. Patient reported the situation regarding her husband  purchasing a car for his sister triggered recent conversations between patient and husband. Patient stated,  "we just have different thought processes of things". Patient reported her husband frequently helps family members financially and does not discuss his financial decisions/support of other family members with patient. Patient stated, "I don't feel there is an emotional boundary" in reference to relationship with husband. Patient reported she feels she would need to ask her husband to refrain from disclosing personal information with others. Patient reported patient's boundaries regarding disclosure of personal information differ from husband's boundaries regarding disclosure of personal information. Patient reported she is concerned marriage counseling may change her husband and stated, "it almost makes me feel nervous about what that will do for him". Patient stated, "I'm good today" in response to current mood.   Interventions: Cognitive Behavioral Therapy and Interpersonal. Clinician conducted session via caregility video from clinician's home office. Patient provided verbal consent to proceed with telehealth session and is aware of limitations of telephone or video visits. Patient participated in session from patient's home. Reviewed events since last session. Discussed recent communication with husband and patient's response. Triggers for conversations. Reflective listening and validation.  Assessed patient's mood since last session and current mood. HW - consider her boundaries as it relates to disclosing personal information and financial decisions.    Collaboration of Care: Other not required at this time   Diagnosis:  Mild episode of recurrent major depressive disorder (HCC)   Anxiety disorder, unspecified type     Plan: Patient is to utilize Cognitive Behavioral Therapy, thought re-framing, healthy communication strategies, mindfulness and coping strategies to decrease symptoms  associated with their diagnosis. Frequency: bi-weekly  Modality: individual      Long-term goal:   Identify, challenge, and replace negative core beliefs, thought patterns, and negative self talk that contribute to feelings of depression, anxiety, and decreased self esteem with positive thoughts, beliefs, and positive self talk per patient's report Target Date: 06/02/24  Progress: progressing    Short-term goal:  Identify, challenge, and replace unhelpful thinking patterns/cognitive distortions that contribute to barriers to patient participating in activities of enjoyment Target Date: 12/01/23  Progress: progressing    Implement mindfulness strategies to promote patient's focus on being in the present Target Date: 12/01/23  Progress: progressing    Develop and implement effective communication strategies for patient to utilize to increase patient's ability to express her thoughts and feelings to others to promote healthy communication with others Target Date: 12/01/23  Progress: progressing    Identify and implement healthy coping strategies for patient to utilize to decrease the impact of stress inducing situations Target Date: 12/01/23  Progress: progressing     Doree Barthel, LCSW

## 2023-11-18 ENCOUNTER — Ambulatory Visit: Payer: BC Managed Care – PPO | Admitting: Clinical

## 2023-11-18 DIAGNOSIS — F419 Anxiety disorder, unspecified: Secondary | ICD-10-CM

## 2023-11-18 DIAGNOSIS — F330 Major depressive disorder, recurrent, mild: Secondary | ICD-10-CM | POA: Diagnosis not present

## 2023-11-18 NOTE — Progress Notes (Signed)
   Doree Barthel, LCSW

## 2023-11-18 NOTE — Progress Notes (Signed)
  [facility] Counselor/Therapist Progress Note  Patient ID: Jo Goodwin, MRN: [id number],    Date: 11/18/2023  Time Spent: 8:36am - 9:21am : 45 minutes   Treatment Type: Individual Therapy  Reported Symptoms: Patient reported feeling "blah"  Mental Status Exam: Appearance:  Neat and Well Groomed     Behavior: Appropriate  Motor: Normal  Speech/Language:  Clear and Coherent and Normal Rate  Affect: Appropriate  Mood: dysthymic  Thought process: normal  Thought content:   WNL  Sensory/Perceptual disturbances:   WNL  Orientation: oriented to person, place, and situation  Attention: Good  Concentration: Good  Memory: WNL  Fund of knowledge:  Good  Insight:   Good  Judgment:  Good  Impulse Control: Good   Risk Assessment: Danger to Self:  No Patient denied current suicidal ideation  Self-injurious Behavior: No Danger to Others: No Patient denied current homicidal ideation Duty to Warn:no Physical Aggression / Violence:No  Access to Firearms a concern: No  Gang Involvement:No   Subjective: Patient stated, "I don't know", "I just feel very blah", "I feel like I'm just going through the motions" in reference to patient's mood. Patient reported the following triggers for recent decline in mood: changes at work, preparing for son and husband birthdays, and stated "felt like just constant stuff always going on". Patient reported she had ideas for patient/husband to celebrate her husband's birthday and her husband requested patient include husband's family in the birthday celebration. Patient stated,"I think it's always a stressor" in reference to planning for husband's birthday.  Patient reported there have been multiple changes at patient's place of work and patient reported she dislikes change. Patient stated, "I'm overwhelmed, I'm tired", "I feel like I'm just functioning at this point".  Patient stated, "we're (colleagues) all kind of being stretched thin". Patient  stated, "I'm trying to be very intentional when I get home about work", "where I struggle with that is my mind is constantly going". Patient stated, "I like to do creative stuff" and reported she enjoys making wreaths, painting. Patient reported the state of her home is a trigger for decline in mood.  Patient stated, "I can at least try it" in reference to self care strategies.   Interventions: Cognitive Behavioral Therapy. Clinician conducted session in person at clinician's office at [facility]. Reviewed events since last session. Assessed patient's mood since last session and current mood. Explored and identified triggers for recent changes in mood. Examined the impact of work related stressors on patient's mood and activity level. Explored and identified self care strategies patient has implemented in response to stressors. Explored and identified activities patient enjoys. Provided psycho education related to depressive symptoms, behavioral activation, and breaking tasks into smaller steps. Discussed strategies to increase self care, such as, scheduling time for self care each day, designating 15 minutes each day towards a household task, and journaling patient's thoughts related to work/setting a timer for 15 minutes of journaling. Clinician will review patient's homework from last session for patient identify patient's boundaries as it relates to disclosing personal information and financial decisions next session.     Collaboration of Care: Other not required at this time   Diagnosis:  Mild episode of recurrent major depressive disorder (HCC)   Anxiety disorder, unspecified type     Plan: Patient is to utilize [employer], thought re-framing, healthy communication strategies, mindfulness and coping strategies to decrease symptoms associated with their diagnosis. Frequency: bi-weekly  Modality: individual  Long-term goal:   Identify, challenge, and replace  negative core beliefs, thought patterns, and negative self talk that contribute to feelings of depression, anxiety, and decreased self esteem with positive thoughts, beliefs, and positive self talk per patient's report Target Date: 06/02/24  Progress: progressing    Short-term goal:  Identify, challenge, and replace unhelpful thinking patterns/cognitive distortions that contribute to barriers to patient participating in activities of enjoyment Target Date: 12/01/23  Progress: progressing    Implement mindfulness strategies to promote patient's focus on being in the present Target Date: 12/01/23  Progress: progressing    Develop and implement effective communication strategies for patient to utilize to increase patient's ability to express her thoughts and feelings to others to promote healthy communication with others Target Date: 12/01/23  Progress: progressing    Identify and implement healthy coping strategies for patient to utilize to decrease the impact of stress inducing situations Target Date: 12/01/23  Progress: progressing     Doree Barthel, LCSW

## 2023-12-02 ENCOUNTER — Ambulatory Visit: Payer: BC Managed Care – PPO | Admitting: Clinical

## 2023-12-16 ENCOUNTER — Ambulatory Visit (INDEPENDENT_AMBULATORY_CARE_PROVIDER_SITE_OTHER): Admitting: Clinical

## 2023-12-16 DIAGNOSIS — F419 Anxiety disorder, unspecified: Secondary | ICD-10-CM

## 2023-12-16 DIAGNOSIS — F330 Major depressive disorder, recurrent, mild: Secondary | ICD-10-CM

## 2023-12-16 NOTE — Progress Notes (Unsigned)
   Doree Barthel, LCSW

## 2023-12-16 NOTE — Progress Notes (Unsigned)
 [facility] Counselor/Therapist Progress Note  Patient ID: Jo Goodwin, MRN: [id number],    Date: 12/16/2023  Time Spent: 2:33pm - 3:40pm : 67 minutes   Treatment Type: Individual Therapy  Reported Symptoms: recent fluctuation in mood  Mental Status Exam: Appearance:  Neat and Well Groomed     Behavior: Appropriate  Motor: Normal  Speech/Language:  Clear and Coherent and Normal Rate  Affect: Appropriate  Mood: normal  Thought process: normal  Thought content:   WNL  Sensory/Perceptual disturbances:   WNL  Orientation: oriented to person, place, and situation  Attention: Good  Concentration: Good  Memory: WNL  Fund of knowledge:  Good  Insight:   Good  Judgment:  Good  Impulse Control: Good   Risk Assessment: Danger to Self:  No Patient denied current suicidal ideation  Self-injurious Behavior: No Danger to Others: No Patient denied current homicidal ideation Duty to Warn:no Physical Aggression / Violence:No  Access to Firearms a concern: No  Gang Involvement:No   Subjective: Patient stated, "for the most part good, I've had a few moments, I'm good today".  Patient reported recent fluctuation in mood due to multiple stressors. Patient stated, "now I'm trying to move past it, I've need some time" and stated, "all things (stressors) have been addressed". Patient reported feeling "very overwhlemed, very stressed" in reference to work related stressors. Patient reported there have been multiple changes at work and patient was delegated additional responsiblities and staff. Patient stated, "it felt very overwhelming". Patient stated, "I feel like I'm just physically, mentally worn out". Patient stated,  "not as much as I would like to" in reference to journaling. Patietn stated, "I have been trying to leave work by 5:30", "I have tried to get a little bit better in that". Patient stated, "I feel like that's probably something that can be attainable in reference to  setting a time to perform a self care activity each day. Patient reported she would like to establsihed the time between 6:30 pm - 6:45pm as time for self care with a goal of extending time frame to 6:30 pm to 7 pm each day. Patient reported she does not take a lunch hour and eats lunch while working if paitent eats lunch. Patient stated, "I feel like I can do that" in response to spending 15 minutes outside each day during patient's lunch hour.   . Bringing food for the week at the beginning of the week for snacks and meals. Patietn stated, "the space that I'm in is trying to put myself first". Reviewed patient's homework - "we (patient/husband) had some conversations on that as well". Shared with husabnd differences in family dynamics that impact patient's thoughts/feelings related to financaial and personal boundaries and shared her feelings re: patient's financail boundaries. "I feel like we were a little more interiwed in how do finances".   Interventions: Cognitive Behavioral Therapy. Clinician conducted session in person at clinician's office at [facility]. Reviewed events since last session and assessed for changes. Discussed recent stressors and the impact on patient's mood and other areas of patient's life. Provided psycho education related to stress and the impact on mental/physical health and self care. Reviewed journaling. Explored barriers to self care. Discussed self care strategeies patient has implemented since last session and the outcome. Explored strategies for self care that patient feel are attainable with patient's scheudle - Discussed behavioral activation strategies to break paitent's goal of 30 minutes of activity each day, setting a timer for 5-10 minutes  of self care each day after work - Discussed sitting outside or in car for 15 minutes at lunch -  Last -  Clinician conducted session in person at clinician's office at [facility]. Reviewed events since last  session. Assessed patient's mood since last session and current mood. Explored and identified triggers for recent changes in mood. Examined the impact of work related stressors on patient's mood and activity level. Explored and identified self care strategies patient has implemented in response to stressors. Explored and identified activities patient enjoys. Provided psycho education related to depressive symptoms, behavioral activation, and breaking tasks into smaller steps. Discussed strategies to increase self care, such as, scheduling time for self care each day, designating 15 minutes each day towards a household task, and journaling patient's thoughts related to work/setting a timer for 15 minutes of journaling. Clinician will review patient's homework from last session for patient identify patient's boundaries as it relates to disclosing personal information and financial decisions next session.      Collaboration of Care: Other not required at this time   Diagnosis:  Mild episode of recurrent major depressive disorder (HCC)   Anxiety disorder, unspecified type     Plan: Patient is to utilize [employer] Therapy, thought re-framing, healthy communication strategies, mindfulness and coping strategies to decrease symptoms associated with their diagnosis. Frequency: bi-weekly  Modality: individual      Long-term goal:   Identify, challenge, and replace negative core beliefs, thought patterns, and negative self talk that contribute to feelings of depression, anxiety, and decreased self esteem with positive thoughts, beliefs, and positive self talk per patient's report Target Date: 06/02/24  Progress: progressing    Short-term goal:  Identify, challenge, and replace unhelpful thinking patterns/cognitive distortions that contribute to barriers to patient participating in activities of enjoyment Target Date: 12/01/23  Progress: progressing    Implement mindfulness strategies to  promote patient's focus on being in the present Target Date: 12/01/23  Progress: progressing    Develop and implement effective communication strategies for patient to utilize to increase patient's ability to express her thoughts and feelings to others to promote healthy communication with others Target Date: 12/01/23  Progress: progressing    Identify and implement healthy coping strategies for patient to utilize to decrease the impact of stress inducing situations Target Date: 12/01/23  Progress: progressing     Doree Barthel, LCSW

## 2024-01-20 ENCOUNTER — Ambulatory Visit: Admitting: Clinical

## 2024-01-20 DIAGNOSIS — F419 Anxiety disorder, unspecified: Secondary | ICD-10-CM | POA: Diagnosis not present

## 2024-01-20 DIAGNOSIS — F330 Major depressive disorder, recurrent, mild: Secondary | ICD-10-CM

## 2024-01-20 NOTE — Progress Notes (Signed)
 [facility] Counselor/Therapist Progress Note  Patient ID: Jo Goodwin, MRN: [id number],    Date: 01/20/2024  Time Spent: 2:34pm - 3:26pm : 52 minutes   Treatment Type: Individual Therapy  Reported Symptoms: none reported  Mental Status Exam: Appearance:  Neat and Well Groomed     Behavior: Appropriate  Motor: Normal  Speech/Language:  Clear and Coherent and Normal Rate  Affect: Appropriate  Mood: normal  Thought process: normal  Thought content:   WNL  Sensory/Perceptual disturbances:   WNL  Orientation: oriented to person, place, and situation  Attention: Good  Concentration: Good  Memory: WNL  Fund of knowledge:  Good  Insight:   Good  Judgment:  Good  Impulse Control: Good   Risk Assessment: Danger to Self:  No Patient denied current suicidal ideation  Self-injurious Behavior: No Danger to Others: No Patient denied current homicidal ideation Duty to Warn:no Physical Aggression / Violence:No  Access to Firearms a concern: No  Gang Involvement:No   Subjective: Patient stated, "kind of up and down", "work related things and some home things in reference to events since last session. Patient stated, "today I'm in a good place". Patient stated, "I think it helped" in response to self care strategies. Patient reported patient was off work for one week and reported patient did the activities patient wanted to participate in during patient's vacation. Patient stated, "I really started taking in little moments of time" in reference to patient's recent vacation. Patient stated, "I think a big part of it is my mindset has changed", "thinking about what is most important" in reference to self care since last session. Patient reported she has been considering situations that are within her control, situations not within patient's control, and situations patient can influence. Patient stated, "we've (patient/husband) had some great conversations", "we're talking more  about things". Patient reported she has changed her response to stressors and patient reported she has changed patient's communication in response to stressors. Patient stated, "the listening part is my struggle, not wanting to jump in". Patient stated, "I feel more comfortable being able to bring something up" in reference to communication with husband. Patient reported she canceled a trip to visit her son recently. Patient reported patient's son did not disclose to patient that his significant other was living with son. Patient stated, "it was more so that he did not tell me". Patient reported feeling patient is not included in her son's life/decisions. Patient stated, "no, I think they've been helpful, at work I've been very intentional about taking lunch" in reference to self care strategies. Patient stated, "what I've realized is I need to disconnect". Patient reported she has been disconnecting from patient's work [employer] when eating lunch in patient's office.  Patient stated, "still working on it", "I think I've made some progress I can see it" in reference to patient's first short term goal. Patient stated, "probably still need to work on that" in reference to patient's second short term goal. Patient stated, "still working on that one" in reference to patient's fourth short term goal.   Interventions: Cognitive Behavioral Therapy. Clinician conducted session via caregility video from clinician's office at [facility]. Patient provided verbal consent to proceed with telehealth session and is aware of limitations of telephone or video visits. Patient participated in session from patient's office. Reviewed events since last session and assessed for changes. Reviewed strategies discussed last session to promote self care and the outcome. Discussed changes in patient's perspective since last session  and the impact on self care and patient's relationship. Reviewed the use of "I" statements and  provided psycho education related to fair fighting rules. Provided psycho education related to healthy communication and active listening. Processed patient's thoughts/feelings related to patient's recent planned trip. Reviewed patient's goals for therapy and patient's progress.    Collaboration of Care: Other not required at this time   Diagnosis:  Mild episode of recurrent major depressive disorder (HCC)   Anxiety disorder, unspecified type     Plan: Patient is to utilize [employer] Therapy, thought re-framing, healthy communication strategies, mindfulness and coping strategies to decrease symptoms associated with their diagnosis. Frequency: bi-weekly  Modality: individual      Long-term goal:   Identify, challenge, and replace negative core beliefs, thought patterns, and negative self talk that contribute to feelings of depression, anxiety, and decreased self esteem with positive thoughts, beliefs, and positive self talk per patient's report Target Date: 06/02/24  Progress: progressing    Short-term goal:  Identify, challenge, and replace unhelpful thinking patterns/cognitive distortions that contribute to barriers to patient participating in activities of enjoyment Target Date: 06/02/24  Progress: progressing    Implement mindfulness strategies to promote patient's focus on being in the present Target Date: 06/02/24  Progress: progressing    Develop and implement effective communication strategies for patient to utilize to increase patient's ability to express her thoughts and feelings to others to promote healthy communication with others Target Date: 06/02/24  Progress: progressing    Identify and implement healthy coping strategies for patient to utilize to decrease the impact of stress inducing situations Target Date: 06/02/24  Progress: progressing       Burlene Carpen, LCSW

## 2024-01-20 NOTE — Progress Notes (Signed)
   Doree Barthel, LCSW

## 2024-02-22 ENCOUNTER — Ambulatory Visit: Admitting: Clinical

## 2024-02-22 DIAGNOSIS — F330 Major depressive disorder, recurrent, mild: Secondary | ICD-10-CM | POA: Diagnosis not present

## 2024-02-22 DIAGNOSIS — F419 Anxiety disorder, unspecified: Secondary | ICD-10-CM | POA: Diagnosis not present

## 2024-02-22 NOTE — Progress Notes (Signed)
   Jo Barthel, LCSW

## 2024-02-22 NOTE — Progress Notes (Signed)
 [facility] Counselor/Therapist Progress Note  Patient ID: MAKARA LANZO, MRN: [id number],    Date: 02/22/2024  Time Spent: 8:35am - 9:35am : 60 minutes   Treatment Type: Individual Therapy  Reported Symptoms: lack of motivation, lack of energy  Mental Status Exam: Appearance:  Neat and Well Groomed     Behavior: Appropriate  Motor: Normal  Speech/Language:  Clear and Coherent and Normal Rate  Affect: Appropriate  Mood: normal  Thought process: normal  Thought content:   WNL  Sensory/Perceptual disturbances:   WNL  Orientation: oriented to person, place, time/date, and situation  Attention: Good  Concentration: Good  Memory: WNL  Fund of knowledge:  Good  Insight:   Good  Judgment:  Good  Impulse Control: Good   Risk Assessment: Danger to Self:  No Patient denied current suicidal ideation  Self-injurious Behavior: No Danger to Others: No Patient denied current homicidal ideation Duty to Warn:no Physical Aggression / Violence:No  Access to Firearms a concern: No  Gang Involvement:No   Subjective: Patient stated, "some days not feeling the best", "today I'm ok", "some days where I'm just very blah" in reference to mood since last session. Patient stated, "I know things I want to change but I just can't get myself in gear to do it". Patient stated, "ever since April I can not get on track, I just feel like I'm going through the motions". Patient identified lack of motivation and lack of energy as barriers to performing self care and performing tasks. Patient reported decreased concentration at times at work. Patient reported staff turnover is a current stressor and patient stated, "I think part of that is wearing on me too", "it just feels like a revolving door", "very draining".  Patient stated, "Its not like I can't function but it feels like it". Patient stated, "still working on it" in reference to self care. Patient reported prior to a recent training  patient brought her lunch to work daily and reported improvement in sleep, sleeping through the night the majority of time. Patient stated, "doing a lot better with that" in reference to taking a break during the day and going outside. Patient stated, "it does feel better" in response to implementation of self care strategies. Patient reported at times she sits in her car at lunch and leaves patient's work phone in the office. Patient stated, "I think it's helping" in response to strategies discussed to promote self care.  Patient reported less interruptions occur when patient works from home.   Interventions: Cognitive Behavioral Therapy and behavioral activation. Clinician conducted session in person at clinician's office at [facility]. Reviewed events since last session and assessed for changes. Discussed recent depressive symptoms. Explored and identified barriers to self care and performing tasks. Discussed patient following up with patient's PCP to discuss decreased energy. Provided psycho education related to depressive symptoms and stress. Discussed recommendation for a consultation with a psychiatrist. Assessed patient's implementation of self care, reviewed strategies previously discussed to promote self care and the outcome. Reviewed strategies to divide tasks into smaller steps and assisted patient in dividing larger tasks into smaller steps as it relates to patient's work, such as, scheduling an hour every two weeks to focus on specific tasks patient would like to accomplish in the office, working from home twice a month, creating task list for the days patient works from home. Clinician requested for homework patient practice self care and practice behavioral activation strategies discussed.    Collaboration of Care:  Other not required at this time   Diagnosis:  Mild episode of recurrent major depressive disorder (HCC)   Anxiety disorder, unspecified type     Plan: Patient is to  utilize [employer] Therapy, thought re-framing, healthy communication strategies, mindfulness and coping strategies to decrease symptoms associated with their diagnosis. Frequency: bi-weekly  Modality: individual      Long-term goal:   Identify, challenge, and replace negative core beliefs, thought patterns, and negative self talk that contribute to feelings of depression, anxiety, and decreased self esteem with positive thoughts, beliefs, and positive self talk per patient's report Target Date: 06/02/24  Progress: progressing    Short-term goal:  Identify, challenge, and replace unhelpful thinking patterns/cognitive distortions that contribute to barriers to patient participating in activities of enjoyment Target Date: 06/02/24  Progress: progressing    Implement mindfulness strategies to promote patient's focus on being in the present Target Date: 06/02/24  Progress: progressing    Develop and implement effective communication strategies for patient to utilize to increase patient's ability to express her thoughts and feelings to others to promote healthy communication with others Target Date: 06/02/24  Progress: progressing    Identify and implement healthy coping strategies for patient to utilize to decrease the impact of stress inducing situations Target Date: 06/02/24  Progress: progressing    Burlene Carpen, LCSW

## 2024-03-15 DIAGNOSIS — M9905 Segmental and somatic dysfunction of pelvic region: Secondary | ICD-10-CM | POA: Diagnosis not present

## 2024-03-15 DIAGNOSIS — M5451 Vertebrogenic low back pain: Secondary | ICD-10-CM | POA: Diagnosis not present

## 2024-03-15 DIAGNOSIS — M9902 Segmental and somatic dysfunction of thoracic region: Secondary | ICD-10-CM | POA: Diagnosis not present

## 2024-03-15 DIAGNOSIS — M9903 Segmental and somatic dysfunction of lumbar region: Secondary | ICD-10-CM | POA: Diagnosis not present

## 2024-03-16 ENCOUNTER — Ambulatory Visit (INDEPENDENT_AMBULATORY_CARE_PROVIDER_SITE_OTHER): Admitting: Clinical

## 2024-03-16 DIAGNOSIS — F419 Anxiety disorder, unspecified: Secondary | ICD-10-CM | POA: Diagnosis not present

## 2024-03-16 DIAGNOSIS — F330 Major depressive disorder, recurrent, mild: Secondary | ICD-10-CM | POA: Diagnosis not present

## 2024-03-16 NOTE — Progress Notes (Signed)
   Doree Barthel, LCSW

## 2024-03-16 NOTE — Progress Notes (Signed)
 [facility] Counselor/Therapist Progress Note  Patient ID: Jo Goodwin, MRN: [id number],    Date: 03/16/2024  Time Spent: 8:36am - 9:38am : 62 minutes   Treatment Type: Individual Therapy  Reported Symptoms: Patient reported fluctuation in mood  Mental Status Exam: Appearance:  Neat and Well Groomed     Behavior: Appropriate  Motor: Normal  Speech/Language:  Clear and Coherent and Normal Rate  Affect: Appropriate  Mood: normal  Thought process: normal  Thought content:   WNL  Sensory/Perceptual disturbances:   WNL  Orientation: oriented to person, place, time/date, and situation  Attention: Good  Concentration: Good  Memory: WNL  Fund of knowledge:  Good  Insight:   Good  Judgment:  Good  Impulse Control: Good   Risk Assessment: Danger to Self:  No Patient denied current suicidal ideation  Self-injurious Behavior: No Danger to Others: No Patient denied current homicidal ideation Duty to Warn:no Physical Aggression / Violence:No  Access to Firearms a concern: No  Gang Involvement:No   Subjective: Patient stated, good, on and off in response to events since last session. Patient stated, up and down, I would say now I'm good in response to mood since last session. Patient reported two staff recently left, requirements associated with staff transitions, multiple household repairs, added financial obligations, and husband's financial decisions regarding husband's family are triggers for recent fluctuation in mood. Patient stated, I'm really getting frustrated with everything that his (husband) family needs, I feel like if I'm not in align with it I'm the outside person and nothing's addressed with me.  Patient reported feeling patient is not able express patient's feelings to her husband. Patient reported husband becomes defensive or provides solutions to the issue when patient expresses her thoughts/feelings. Patient stated, I think he (husband) sees  it as his finances, I think he sees it (finances) as shared with his family.  Patient stated, I'm frustrated with it and stated, I don't feel like they (husband's family) see me as his wife and in the family as his wife. Patient reported husband's schedule has been a barrier to scheduling an appointment for marriage counseling. Patient reported patient/husband need appointment options at 7am or on weekends. Patient stated, I'm at a point that we have to do something in reference to patient's relationship. Patient stated, he (husband) always wants to be the fixer as it relates to husband's family. Patient stated, feels like you (husband) see me as the outside too in reference to husband's perception of patient.   Interventions: Cognitive Behavioral Therapy and Interpersonal. Clinician conducted session in person at clinician's office at [facility]. Reviewed events since last session and assessed for changes. Explored and identified triggers for fluctuations in mood. Processed patient's thoughts and feelings regarding recent stressors, relationship with husband and family dynamics. Explored patient/husband's perspectives as it relates to finances and family structure. Reviewed recommendation for marriage counseling. Provided psycho education related to generating alternatives and assisted patient in generating alternatives in response to recent thoughts/situations. Clinician will review patient's homework to practice self care and practice behavioral activation strategies discussed during follow up session.    Collaboration of Care: Other not required at this time   Diagnosis:  Mild episode of recurrent major depressive disorder (HCC)   Anxiety disorder, unspecified type     Plan: Patient is to utilize [employer] Therapy, thought re-framing, healthy communication strategies, mindfulness and coping strategies to decrease symptoms associated with their diagnosis. Frequency:  bi-weekly  Modality: individual  Long-term goal:   Identify, challenge, and replace negative core beliefs, thought patterns, and negative self talk that contribute to feelings of depression, anxiety, and decreased self esteem with positive thoughts, beliefs, and positive self talk per patient's report Target Date: 06/02/24  Progress: progressing    Short-term goal:  Identify, challenge, and replace unhelpful thinking patterns/cognitive distortions that contribute to barriers to patient participating in activities of enjoyment Target Date: 06/02/24  Progress: progressing    Implement mindfulness strategies to promote patient's focus on being in the present Target Date: 06/02/24  Progress: progressing    Develop and implement effective communication strategies for patient to utilize to increase patient's ability to express her thoughts and feelings to others to promote healthy communication with others Target Date: 06/02/24  Progress: progressing    Identify and implement healthy coping strategies for patient to utilize to decrease the impact of stress inducing situations Target Date: 06/02/24  Progress: progressing    Darice Seats, LCSW

## 2024-03-25 DIAGNOSIS — M5451 Vertebrogenic low back pain: Secondary | ICD-10-CM | POA: Diagnosis not present

## 2024-03-25 DIAGNOSIS — M9903 Segmental and somatic dysfunction of lumbar region: Secondary | ICD-10-CM | POA: Diagnosis not present

## 2024-03-25 DIAGNOSIS — M9902 Segmental and somatic dysfunction of thoracic region: Secondary | ICD-10-CM | POA: Diagnosis not present

## 2024-03-25 DIAGNOSIS — M9905 Segmental and somatic dysfunction of pelvic region: Secondary | ICD-10-CM | POA: Diagnosis not present

## 2024-04-04 ENCOUNTER — Ambulatory Visit: Admitting: Clinical

## 2024-04-04 DIAGNOSIS — F330 Major depressive disorder, recurrent, mild: Secondary | ICD-10-CM | POA: Diagnosis not present

## 2024-04-04 DIAGNOSIS — F419 Anxiety disorder, unspecified: Secondary | ICD-10-CM

## 2024-04-04 NOTE — Progress Notes (Unsigned)
   Jo Barthel, LCSW
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
## 2024-04-08 DIAGNOSIS — M9903 Segmental and somatic dysfunction of lumbar region: Secondary | ICD-10-CM | POA: Diagnosis not present

## 2024-04-08 DIAGNOSIS — M9902 Segmental and somatic dysfunction of thoracic region: Secondary | ICD-10-CM | POA: Diagnosis not present

## 2024-04-08 DIAGNOSIS — M9905 Segmental and somatic dysfunction of pelvic region: Secondary | ICD-10-CM | POA: Diagnosis not present

## 2024-04-08 DIAGNOSIS — M5451 Vertebrogenic low back pain: Secondary | ICD-10-CM | POA: Diagnosis not present

## 2024-04-20 ENCOUNTER — Ambulatory Visit: Admitting: Clinical

## 2024-04-20 DIAGNOSIS — F419 Anxiety disorder, unspecified: Secondary | ICD-10-CM | POA: Diagnosis not present

## 2024-04-20 DIAGNOSIS — F330 Major depressive disorder, recurrent, mild: Secondary | ICD-10-CM

## 2024-04-20 NOTE — Progress Notes (Signed)
 [facility] Counselor/Therapist Progress Note  Patient ID: Jo Goodwin, MRN: [id number],    Date: 04/20/2024  Time Spent: 9:40am - 10:34am : 54 minutes   Treatment Type: Individual Therapy  Reported Symptoms: recent feelings of anxiety, difficulty sleeping  Mental Status Exam: Appearance:  Neat and Well Groomed     Behavior: Appropriate  Motor: Normal  Speech/Language:  Clear and Coherent and Normal Rate  Affect: Appropriate  Mood: normal  Thought process: normal  Thought content:   WNL  Sensory/Perceptual disturbances:   WNL  Orientation: oriented to person, place, time/date, and situation  Attention: Good  Concentration: Good  Memory: WNL  Fund of knowledge:  Good  Insight:   Good  Judgment:  Good  Impulse Control: Good   Risk Assessment: Danger to Self:  No Patient denied current suicidal ideation  Self-injurious Behavior: No Danger to Others: No Patient denied current homicidal ideation Duty to Warn:no Physical Aggression / Violence:No  Access to Firearms a concern: No  Gang Involvement:No   Subjective: Patient stated, I'm doing good, I needed a work from home day, other than that a few moments during the week, other than that I've been good. Patient reported work related stressors due to several staff turning in their resignations. Patient stated, I was getting a little anxious about what that looks like for me, I think parts of that I was feeling a little overwhelmed in reference to staff turnover. Patient stated, we had a break through in reference to relationship with husband. Patient reported a recent conversation with husband regarding finances triggered breakthrough in the relationship. Patient reported patient observed patient was starting to shut down during recent financial conversation and patient stated, I had checked out. Patient reported patient's husband showed vulnerability during their conversation and patient stated,  since then communication has been much better. Patient stated, I felt like I needed to see that in reference to husband showing vulnerability.  Patient stated, Its not impacted me yet, I'm trying to mindful that it could in reference to work related stressors. Patient stated, Its the stress of how that's going to look for my workers, I feel like I've taken on more of that than I needed to, that's added more anxiety and stress to me. Patient reported difficulty sleeping due to thoughts regarding work related stressors. Patient stated, Im trying to think how to get through those moments, thinking of the what ifs. Patient stated I'm good in response to patient's current mood.   Interventions: Cognitive Behavioral Therapy. Clinician conducted session in person at clinician's office at [facility]. Reviewed events since last session and assessed for changes. Clarified patient's report of a few moments since last session and explored triggers for moments. Processed breakthrough between patient and husband, patient's thoughts/feelings in response, and the impact on patient's perception of the relationship. Discussed work related stressors and the impact on patient. Provided psycho education related to cognitive restructuring, challenging negative thoughts, and cognitive distortions (over generalization). Discussed strategies to decrease work related stressors and patient's approach to staff dynamics. Clinician requested for homework patient practice challenging negative thoughts using socratic questions.    Collaboration of Care: Other not required at this time   Diagnosis:  Mild episode of recurrent major depressive disorder (HCC)   Anxiety disorder, unspecified type     Plan: Patient is to utilize [employer], thought re-framing, healthy communication strategies, mindfulness and coping strategies to decrease symptoms associated with their diagnosis. Frequency:  bi-weekly  Modality: individual  Long-term goal:   Identify, challenge, and replace negative core beliefs, thought patterns, and negative self talk that contribute to feelings of depression, anxiety, and decreased self esteem with positive thoughts, beliefs, and positive self talk per patient's report Target Date: 06/02/24  Progress: progressing    Short-term goal:  Identify, challenge, and replace unhelpful thinking patterns/cognitive distortions that contribute to barriers to patient participating in activities of enjoyment Target Date: 06/02/24  Progress: progressing    Implement mindfulness strategies to promote patient's focus on being in the present Target Date: 06/02/24  Progress: progressing    Develop and implement effective communication strategies for patient to utilize to increase patient's ability to express her thoughts and feelings to others to promote healthy communication with others Target Date: 06/02/24  Progress: progressing    Identify and implement healthy coping strategies for patient to utilize to decrease the impact of stress inducing situations Target Date: 06/02/24  Progress: progressing     Darice Seats, LCSW

## 2024-04-20 NOTE — Progress Notes (Signed)
   Jo Barthel, LCSW

## 2024-05-02 ENCOUNTER — Ambulatory Visit: Admitting: Clinical

## 2024-05-18 ENCOUNTER — Ambulatory Visit: Admitting: Clinical

## 2024-05-18 DIAGNOSIS — F419 Anxiety disorder, unspecified: Secondary | ICD-10-CM

## 2024-05-18 DIAGNOSIS — F330 Major depressive disorder, recurrent, mild: Secondary | ICD-10-CM | POA: Diagnosis not present

## 2024-05-18 NOTE — Progress Notes (Unsigned)
 [facility] Counselor/Therapist Progress Note  Patient ID: Jo Goodwin, MRN: [id number],    Date: 05/18/2024  Time Spent: 9:36am - 10:32am : 56 minutes   Treatment Type: Individual Therapy  Reported Symptoms: feeling lost  Mental Status Exam: Appearance:  Neat and Well Groomed     Behavior: Appropriate  Motor: Normal  Speech/Language:  Clear and Coherent and Normal Rate  Affect: Tearful  Mood: sad  Thought process: normal  Thought content:   WNL  Sensory/Perceptual disturbances:   WNL  Orientation: oriented to person, place, time/date, and situation  Attention: Good  Concentration: Good  Memory: WNL  Fund of knowledge:  Good  Insight:   Good  Judgment:  Good  Impulse Control: Good   Risk Assessment: Danger to Self:  No Patient denied current suicidal ideation  Self-injurious Behavior: No Danger to Others: No Patient denied current homicidal ideation Duty to Warn:no Physical Aggression / Violence:No  Access to Firearms a concern: No  Gang Involvement:No   Subjective:  Patient stated, not horrible in response to events since last session. Patient stated, I think it's just the feelings I'm in, overall I've been good in response to mood since last session. Patient stated, I feel like I'm a little lost and I can't quite figure out why. Patient stated, It feels like a lot of times I'm just going through the motions every day. Patient reported patient did not feel lost when patient worked remotely, was single, and worked for a different company. Patient stated, if I just came to work every day and did what my job requires me to do, that would not wear me out on the regular. Patient stated, In my personal life id like to feel like I'm doing something exciting. Patient reported patient does not feel she has the freedom to participate in activities due to husband's schedule and husband's commitments to others. Patient reported patient would like to spend  time with husband on the weekends. Patient stated, nothing's consistent in reference to strategies patient has implemented to increase time with husband. Patient stated, I get to myself and I feel like I'm quiet in response to recent interactions with husband. Patient stated, I shut down and it's not helping. Patient reported feeling patient/husband do not actively listen to each other in conversations. Patient reported patient feels patient's perspective of husband and husband's perspective of patient have changed due to their responses to situations. Patient stated, I start to get frustrated when I don't feel like I'm being heard, I struggle with feeling shut down and I don't like feeling shut down. Patient stated, I feel like I'm always shut down when I start expressing things. Patient stated, where am I free to be who I am.  Patient stated, I think I need to get back into what is it I really like to do, I feel like I've lost that, I feel like I've lost what I like to do. Patient stated, I've got complacent in response to barriers.   Interventions: Cognitive Behavioral Therapy. Clinician conducted session in person at clinician's office at [facility]. Reviewed events since last session and assessed for changes. Explored feelings of being lost and triggers for feeling lost. Explored and identified a time when patient did not feel lost. Explored patient's ideal week. Discussed patient/husband's communication style and patterns of communication. Processed changes in patient's perspective of husband and the impact on their relationship.  Provided psycho education related to using I statements. Reviewed importance of self care  and strategies to increase self care and participation in enjoyable activities, such as, crafting class at [facility] or another place of business. Assisted patient in explored and identifying barriers to participation in enjoyable activities and participation in  activities independently. Clinician required for homework patient practice participating in one positive activity each weekend.     Collaboration of Care: Other not required at this time   Diagnosis:  Mild episode of recurrent major depressive disorder (HCC)   Anxiety disorder, unspecified type     Plan: Patient is to utilize [employer], thought re-framing, healthy communication strategies, mindfulness and coping strategies to decrease symptoms associated with their diagnosis. Frequency: bi-weekly  Modality: individual      Long-term goal:   Identify, challenge, and replace negative core beliefs, thought patterns, and negative self talk that contribute to feelings of depression, anxiety, and decreased self esteem with positive thoughts, beliefs, and positive self talk per patient's report Target Date: 06/02/24  Progress: progressing    Short-term goal:  Identify, challenge, and replace unhelpful thinking patterns/cognitive distortions that contribute to barriers to patient participating in activities of enjoyment Target Date: 06/02/24  Progress: progressing    Implement mindfulness strategies to promote patient's focus on being in the present Target Date: 06/02/24  Progress: progressing    Develop and implement effective communication strategies for patient to utilize to increase patient's ability to express her thoughts and feelings to others to promote healthy communication with others Target Date: 06/02/24  Progress: progressing    Identify and implement healthy coping strategies for patient to utilize to decrease the impact of stress inducing situations Target Date: 06/02/24  Progress: progressing     Darice Seats, LCSW

## 2024-05-18 NOTE — Progress Notes (Unsigned)
   Jo Seats, LCSW

## 2024-06-15 ENCOUNTER — Ambulatory Visit: Admitting: Clinical

## 2024-06-15 ENCOUNTER — Ambulatory Visit: Admitting: Nurse Practitioner

## 2024-06-27 ENCOUNTER — Ambulatory Visit: Admitting: Clinical

## 2024-07-06 DIAGNOSIS — Z1231 Encounter for screening mammogram for malignant neoplasm of breast: Secondary | ICD-10-CM | POA: Diagnosis not present

## 2024-07-06 LAB — HM MAMMOGRAPHY

## 2024-07-12 ENCOUNTER — Ambulatory Visit: Admitting: Nurse Practitioner

## 2024-07-12 VITALS — BP 118/80 | HR 69 | Temp 98.3°F | Ht 67.5 in | Wt 263.8 lb

## 2024-07-12 DIAGNOSIS — R5383 Other fatigue: Secondary | ICD-10-CM | POA: Diagnosis not present

## 2024-07-12 DIAGNOSIS — Z13220 Encounter for screening for lipoid disorders: Secondary | ICD-10-CM

## 2024-07-12 DIAGNOSIS — R350 Frequency of micturition: Secondary | ICD-10-CM | POA: Insufficient documentation

## 2024-07-12 DIAGNOSIS — F411 Generalized anxiety disorder: Secondary | ICD-10-CM

## 2024-07-12 DIAGNOSIS — I10 Essential (primary) hypertension: Secondary | ICD-10-CM | POA: Diagnosis not present

## 2024-07-12 DIAGNOSIS — R400 Somnolence: Secondary | ICD-10-CM | POA: Insufficient documentation

## 2024-07-12 DIAGNOSIS — Z0000 Encounter for general adult medical examination without abnormal findings: Secondary | ICD-10-CM

## 2024-07-12 DIAGNOSIS — Z131 Encounter for screening for diabetes mellitus: Secondary | ICD-10-CM

## 2024-07-12 LAB — CBC WITH DIFFERENTIAL/PLATELET
Basophils Absolute: 0.1 K/uL (ref 0.0–0.1)
Basophils Relative: 1 % (ref 0.0–3.0)
Eosinophils Absolute: 0.2 K/uL (ref 0.0–0.7)
Eosinophils Relative: 3.4 % (ref 0.0–5.0)
HCT: 41.5 % (ref 36.0–46.0)
Hemoglobin: 13.8 g/dL (ref 12.0–15.0)
Lymphocytes Relative: 28.9 % (ref 12.0–46.0)
Lymphs Abs: 1.7 K/uL (ref 0.7–4.0)
MCHC: 33.3 g/dL (ref 30.0–36.0)
MCV: 85.4 fl (ref 78.0–100.0)
Monocytes Absolute: 0.4 K/uL (ref 0.1–1.0)
Monocytes Relative: 7.4 % (ref 3.0–12.0)
Neutro Abs: 3.5 K/uL (ref 1.4–7.7)
Neutrophils Relative %: 59.3 % (ref 43.0–77.0)
Platelets: 257 K/uL (ref 150.0–400.0)
RBC: 4.86 Mil/uL (ref 3.87–5.11)
RDW: 13.4 % (ref 11.5–15.5)
WBC: 6 K/uL (ref 4.0–10.5)

## 2024-07-12 LAB — HEMOGLOBIN A1C: Hgb A1c MFr Bld: 5.6 % (ref 4.6–6.5)

## 2024-07-12 LAB — LIPID PANEL
Cholesterol: 165 mg/dL (ref 0–200)
HDL: 38.5 mg/dL — ABNORMAL LOW (ref >39.00)
LDL Cholesterol: 115 mg/dL — ABNORMAL HIGH (ref 0–99)
NonHDL: 126.8
Total CHOL/HDL Ratio: 4
Triglycerides: 61 mg/dL (ref 0.0–149.0)
VLDL: 12.2 mg/dL (ref 0.0–40.0)

## 2024-07-12 LAB — COMPREHENSIVE METABOLIC PANEL WITH GFR
ALT: 19 U/L (ref 0–35)
AST: 16 U/L (ref 0–37)
Albumin: 4.1 g/dL (ref 3.5–5.2)
Alkaline Phosphatase: 68 U/L (ref 39–117)
BUN: 13 mg/dL (ref 6–23)
CO2: 29 meq/L (ref 19–32)
Calcium: 8.6 mg/dL (ref 8.4–10.5)
Chloride: 103 meq/L (ref 96–112)
Creatinine, Ser: 0.83 mg/dL (ref 0.40–1.20)
GFR: 83.31 mL/min (ref >60.00)
Glucose, Bld: 95 mg/dL (ref 70–99)
Potassium: 4.2 meq/L (ref 3.5–5.1)
Sodium: 137 meq/L (ref 135–145)
Total Bilirubin: 0.4 mg/dL (ref 0.2–1.2)
Total Protein: 6.8 g/dL (ref 6.0–8.3)

## 2024-07-12 LAB — VITAMIN D 25 HYDROXY (VIT D DEFICIENCY, FRACTURES): VITD: 15.63 ng/mL — ABNORMAL LOW (ref 30.00–100.00)

## 2024-07-12 LAB — VITAMIN B12: Vitamin B-12: 260 pg/mL (ref 211–911)

## 2024-07-12 LAB — TSH: TSH: 1.1 u[IU]/mL (ref 0.35–5.50)

## 2024-07-12 MED ORDER — HYDROXYZINE HCL 10 MG PO TABS: 10.0000 mg | ORAL_TABLET | Freq: Three times a day (TID) | ORAL | 0 refills | Status: AC | PRN

## 2024-07-12 NOTE — Assessment & Plan Note (Signed)
 Will check UA with reflex culture if indicated.  Patient like to see GYN as she thinks she has a form of prolapse causing the urinary frequency

## 2024-07-12 NOTE — Assessment & Plan Note (Signed)
 Pending labs consider at home sleep study

## 2024-07-12 NOTE — Patient Instructions (Signed)
 Nice to see you today Follow up with me in 1 year for your next phyiscal  Wegovy and Zepbound are the injectable weight loss medications  Qysmia and Contrave are the oral weight loss medications   [facility]  Address: [address] [place], [place], [place] [address] Hours:  Closes soon ? 5?PM ? Opens 7?AM Tue Confirmed by this business 7 weeks ago Phone: [phone]   Once you find a GYN you want to see let me know and I can place a referral if needed

## 2024-07-12 NOTE — Assessment & Plan Note (Signed)
 History of same that is intermittent and infrequent.  Patient has hydroxyzine  10 mg that she will take as needed.  Needs refill as her other prescription expired.  Refill provided today

## 2024-07-12 NOTE — Assessment & Plan Note (Signed)
 Did discuss healthy lifestyle modifications.  She is working on exercise and on her diet.  She is to eat more frequently throughout the day.  She will reach out to her insurance to see what weight loss medications are covered.  Patient was also given information to the healthy weight wellness clinic.  Pending TSH, A1c, lipid panel

## 2024-07-12 NOTE — Assessment & Plan Note (Signed)
 Discussed age-appropriate immunizations and screening exams.  Did review patient's personal, surgical, social, family histories.  Patient is up-to-date with all age-appropriate vaccinations she would like.  Patient is up-to-date on CRC screening.  No longer Participates in cervical cancer screening status post hysterectomy.  Patient's mammogram is up-to-date.  Patient was given information at discharge about preventative healthcare maintenance with anticipatory guidance.

## 2024-07-12 NOTE — Assessment & Plan Note (Signed)
 History of the same currently maintained on losartan  25 mg daily.  Blood pressure controlled continue medication as prescribed

## 2024-07-12 NOTE — Progress Notes (Signed)
 Established Patient Office Visit  Subjective   Patient ID: Jo Goodwin, female    DOB: Aug 10, 1976  Age: 48 y.o. MRN: [id number]  Chief Complaint  Patient presents with   Medication Management    Pt would like to discuss alternative weight loss medications. Per her weight loss program. Pt states she does not want to use phentermine    Annual Exam    HPI  HTN: currently on losartan  25mg  daily. Does have a cuff and will check it as needed. States that over the last  month her cuff broke  GAD: currently maintained on hydroxyzine  10mg  prn. States that she has not taken it in 6 months.   for complete physical and follow up of chronic conditions.  Immunizations: -Tetanus: Completed in 2019 -Influenza: Declined -Shingles: too young currently average risk -Pneumonia: Too young  Diet: Fair diet. She is not eating enough. States that hse will go all day at work and have a snack like cookies and chips. Then dinner that is mostly at home but sometimes she will do fast food. She is drinking water mostly through. She will do coffee in the am. Gingerale 3-4 times to a week  Exercise: No regular exercise. Statse that she struggles with this. Has a treadmill at home that she will use intermittently but not consistently. She is a part of a weight loss program through work. They did labs and they will add phentramine.  Patient does not want to use phentermine as she is prior to the past  Eye exam: Completes annuallycontacts and glasses   Dental exam: Needs to update   Colonoscopy: Completed in 08/02/2020, recall  Lung Cancer Screening: NA   Pap smear: hysterectomy. Needs referral for GYN.  She will look around and let me know which GYN she would like to see  Mammogram: 07/06/2024, repeat in 1 year.   DEXA: too young  Sleep: goes to bed 1030-11 and alseep 12 and get up around 530. Does not feel rested. Does snore. Does get sleepy through the day       Review of Systems   Constitutional:  Negative for chills and fever.  Respiratory:  Negative for shortness of breath.   Cardiovascular:  Negative for chest pain and leg swelling.  Gastrointestinal:  Negative for abdominal pain, blood in stool, constipation, diarrhea, nausea and vomiting.       BM daily that smaller volume  Genitourinary:  Negative for dysuria and hematuria.  Neurological:  Negative for tingling and headaches.  Psychiatric/Behavioral:  Negative for hallucinations and suicidal ideas.       Objective:     BP 118/80   Pulse 69   Temp 98.3 F (36.8 C) (Oral)   Ht 5' 7.5 (1.715 m)   Wt 263 lb 12.8 oz (119.7 kg)   LMP 11/02/2018 (Exact Date)   SpO2 96%   BMI 40.71 kg/m  BP Readings from Last 3 Encounters:  07/12/24 118/80  01/27/23 (!) 134/90  06/16/22 130/82   Wt Readings from Last 3 Encounters:  07/12/24 263 lb 12.8 oz (119.7 kg)  01/27/23 258 lb (117 kg)  06/16/22 256 lb 8 oz (116.3 kg)   SpO2 Readings from Last 3 Encounters:  07/12/24 96%  01/27/23 100%  06/16/22 100%      Physical Exam Vitals and nursing note reviewed.  Constitutional:      Appearance: Normal appearance.  HENT:     Right Ear: Tympanic membrane, ear canal and external ear normal.  Left Ear: Tympanic membrane, ear canal and external ear normal.     Mouth/Throat:     Mouth: Mucous membranes are moist.     Pharynx: Oropharynx is clear.  Eyes:     Extraocular Movements: Extraocular movements intact.     Pupils: Pupils are equal, round, and reactive to light.  Cardiovascular:     Rate and Rhythm: Normal rate and regular rhythm.     Pulses: Normal pulses.     Heart sounds: Normal heart sounds.  Pulmonary:     Effort: Pulmonary effort is normal.     Breath sounds: Normal breath sounds.  Abdominal:     General: Bowel sounds are normal. There is no distension.     Palpations: There is no mass.     Tenderness: There is no abdominal tenderness.     Hernia: No hernia is present.  Musculoskeletal:      Right lower leg: No edema.     Left lower leg: No edema.  Lymphadenopathy:     Cervical: No cervical adenopathy.  Skin:    General: Skin is warm.  Neurological:     General: No focal deficit present.     Mental Status: She is alert.     Deep Tendon Reflexes:     Reflex Scores:      Bicep reflexes are 2+ on the right side and 2+ on the left side.      Patellar reflexes are 2+ on the right side and 2+ on the left side.    Comments: Bilateral upper and lower extremity strength 5/5  Psychiatric:        Mood and Affect: Mood normal.        Behavior: Behavior normal.        Thought Content: Thought content normal.        Judgment: Judgment normal.      No results found for any visits on 07/12/24.    The 10-year ASCVD risk score (Arnett DK, et al., 2019) is: 3%    Assessment & Plan:   Problem List Items Addressed This Visit       Cardiovascular and Mediastinum   HTN (hypertension)   History of the same currently maintained on losartan  25 mg daily.  Blood pressure controlled continue medication as prescribed      Relevant Medications   hydrOXYzine  (ATARAX ) 10 MG tablet   Other Relevant Orders   CBC with Differential/Platelet   Comprehensive metabolic panel with GFR   TSH     Other   Preventative health care - Primary   Discussed age-appropriate immunizations and screening exams.  Did review patient's personal, surgical, social, family histories.  Patient is up-to-date with all age-appropriate vaccinations she would like.  Patient is up-to-date on CRC screening.  No longer Participates in cervical cancer screening status post hysterectomy.  Patient's mammogram is up-to-date.  Patient was given information at discharge about preventative healthcare maintenance with anticipatory guidance.      Fatigue   Multifactorial.  Will check basic labs inclusive of CBC, TSH, B12, vitamin D .  If metabolically normal consider sleep study      Relevant Orders   Vitamin B12   CBC  with Differential/Platelet   Comprehensive metabolic panel with GFR   VITAMIN D  25 Hydroxy (Vit-D Deficiency, Fractures)   GAD (generalized anxiety disorder)   History of same that is intermittent and infrequent.  Patient has hydroxyzine  10 mg that she will take as needed.  Needs refill as her other prescription  expired.  Refill provided today      Relevant Medications   hydrOXYzine  (ATARAX ) 10 MG tablet   Morbid obesity (HCC)   Did discuss healthy lifestyle modifications.  She is working on exercise and on her diet.  She is to eat more frequently throughout the day.  She will reach out to her insurance to see what weight loss medications are covered.  Patient was also given information to the healthy weight wellness clinic.  Pending TSH, A1c, lipid panel      Relevant Orders   Hemoglobin A1c   Daytime sleepiness   Pending labs consider at home sleep study      Urinary frequency   Will check UA with reflex culture if indicated.  Patient like to see GYN as she thinks she has a form of prolapse causing the urinary frequency      Relevant Orders   Urinalysis w microscopic + reflex cultur   Other Visit Diagnoses       Screening for lipid disorders       Relevant Orders   Lipid panel     Screening for diabetes mellitus       Relevant Orders   Hemoglobin A1c       No follow-ups on file.    Adina Crandall, NP

## 2024-07-12 NOTE — Assessment & Plan Note (Signed)
 Multifactorial.  Will check basic labs inclusive of CBC, TSH, B12, vitamin D .  If metabolically normal consider sleep study

## 2024-07-13 ENCOUNTER — Ambulatory Visit: Payer: Self-pay | Admitting: Nurse Practitioner

## 2024-07-13 DIAGNOSIS — E559 Vitamin D deficiency, unspecified: Secondary | ICD-10-CM

## 2024-07-13 LAB — URINALYSIS W MICROSCOPIC + REFLEX CULTURE
Bacteria, UA: NONE SEEN /HPF
Bilirubin Urine: NEGATIVE
Glucose, UA: NEGATIVE
Hgb urine dipstick: NEGATIVE
Hyaline Cast: NONE SEEN /LPF
Ketones, ur: NEGATIVE
Leukocyte Esterase: NEGATIVE
Nitrites, Initial: NEGATIVE
Protein, ur: NEGATIVE
RBC / HPF: NONE SEEN /HPF (ref 0–2)
Specific Gravity, Urine: 1.013 (ref 1.001–1.035)
Squamous Epithelial / HPF: NONE SEEN /HPF (ref <=5)
WBC, UA: NONE SEEN /HPF (ref 0–5)
pH: 6 (ref 5.0–8.0)

## 2024-07-13 LAB — NO CULTURE INDICATED

## 2024-07-13 MED ORDER — VITAMIN D (ERGOCALCIFEROL) 1.25 MG (50000 UNIT) PO CAPS
50000.0000 [IU] | ORAL_CAPSULE | ORAL | 0 refills | Status: AC
Start: 2024-07-13 — End: ?

## 2024-07-20 ENCOUNTER — Ambulatory Visit: Admitting: Nurse Practitioner

## 2024-07-20 ENCOUNTER — Ambulatory Visit: Admitting: Clinical

## 2024-07-20 ENCOUNTER — Encounter: Payer: Self-pay | Admitting: Clinical

## 2024-07-20 DIAGNOSIS — F419 Anxiety disorder, unspecified: Secondary | ICD-10-CM

## 2024-07-20 DIAGNOSIS — F330 Major depressive disorder, recurrent, mild: Secondary | ICD-10-CM

## 2024-07-20 NOTE — Progress Notes (Signed)
 [facility] Counselor/Therapist Progress Note  Patient ID: Jo Goodwin, MRN: [id number],    Date: 07/20/2024  Time Spent: 8:35am - 9:33am : 58 minutes   Treatment Type: Individual Therapy  Reported Symptoms: none reported  Mental Status Exam: Appearance:  Neat and Well Groomed     Behavior: Appropriate  Motor: Normal  Speech/Language:  Clear and Coherent and Normal Rate  Affect: Appropriate  Mood: normal  Thought process: normal  Thought content:   WNL  Sensory/Perceptual disturbances:   WNL  Orientation: oriented to person, place, time/date, and situation  Attention: Good  Concentration: Good  Memory: WNL  Fund of knowledge:  Good  Insight:   Good  Judgment:  Good  Impulse Control: Good   Risk Assessment: Danger to Self:  No Patient denied current suicidal ideation  Self-injurious Behavior: No Danger to Others: No Patient denied current homicidal ideation Duty to Warn:no Physical Aggression / Violence:No  Access to Firearms a concern: No  Gang Involvement:No   Subjective:  Patient stated, good in response to events since last session and stated, right now I'm good. Patient stated, I know in several days I've shut down, a lot of emotions in response to recent stressors. Patient reported continued work related stressors and reported a significant amount of staff turnover at patient's place of employment. Patient stated, I feel like a brunt of the stuff has been put on me in reference to additional job responsibilities due to staff turnover. Patient stated, it's been a headache at work. Patient reported patient has considered other job opportunities due to increase in work related stressors. Patient reported experiencing the thought, who is more important in reference to exploring job opportunities.  Patient stated, that brings a little anxiety to it in reference to potentially changing jobs. Patient reported minimal communication/interaction  with family members and patient reported feelings of resentment in response to family dynamics. Patient stated, I have not done well on that in response to homework assignment.  Patient stated, still working on that in response to patient's long term goal and first short term goal. Patient stated, doing a little better with that one, still some struggle in response to patient's second short term goal. Patient stated, I do feel like I'm better, I'm definitely being more mindful of my conversation and the way in which I say things, I do feel like I have been able to speak up a little bit more in a way that doesn't feel accusatory in response to third short term goal. Patient stated, still working on that, still go into a little shut down in response to fourth short term goal.   Interventions: Cognitive Behavioral Therapy. Clinician conducted session in person at clinician's office at [facility]. Reviewed events since last session and assessed for changes. Discussed recent work related stressors and the impact on patient's mood. Discussed thoughts triggered by work related stressors and thoughts regarding potential job change. Reviewed status of family dynamics and explored/identified feelings triggered by status of family dynamics. Reviewed patient's homework. Reviewed patient's goals for therapy and patient's progress towards goals. Clinician requested for homework patient complete best possible self exercise.    Collaboration of Care: Other not required at this time   Diagnosis:  Mild episode of recurrent major depressive disorder (HCC)   Anxiety disorder, unspecified type     Plan: Patient is to utilize [employer], thought re-framing, healthy communication strategies, mindfulness and coping strategies to decrease symptoms associated with their diagnosis. Frequency: bi-weekly  Modality: individual      Long-term goal:   Identify, challenge, and replace  negative core beliefs, thought patterns, and negative self talk that contribute to feelings of depression, anxiety, and decreased self esteem with positive thoughts, beliefs, and positive self talk per patient's report Target Date: 06/02/25  Progress: progressing    Short-term goal:  Identify, challenge, and replace unhelpful thinking patterns/cognitive distortions that contribute to barriers to patient participating in activities of enjoyment Target Date: 06/02/25  Progress: progressing    Implement mindfulness strategies to promote patient's focus on being in the present Target Date: 06/02/25  Progress: progressing    Develop and implement effective communication strategies for patient to utilize to increase patient's ability to express her thoughts and feelings to others to promote healthy communication with others Target Date: 06/02/25  Progress: progressing    Identify and implement healthy coping strategies for patient to utilize to decrease the impact of stress inducing situations Target Date: 06/02/25  Progress: progressing     Darice Seats, LCSW

## 2024-07-20 NOTE — Progress Notes (Signed)
   Darice Seats, LCSW

## 2024-08-29 ENCOUNTER — Ambulatory Visit: Admitting: Clinical

## 2024-08-29 DIAGNOSIS — F330 Major depressive disorder, recurrent, mild: Secondary | ICD-10-CM

## 2024-08-29 DIAGNOSIS — F419 Anxiety disorder, unspecified: Secondary | ICD-10-CM

## 2024-08-29 NOTE — Progress Notes (Signed)
 [facility] Counselor/Therapist Progress Note  Patient ID: Jo Goodwin, MRN: [id number],    Date: 08/29/2024  Time Spent: 1:35pm - 2:39pm : 64 minutes   Treatment Type: Individual Therapy  Reported Symptoms:  none reported  Mental Status Exam: Appearance:  Neat and Well Groomed     Behavior: Appropriate  Motor: Normal  Speech/Language:  Clear and Coherent and Normal Rate  Affect: Appropriate  Mood: normal  Thought process: normal  Thought content:   WNL  Sensory/Perceptual disturbances:   WNL  Orientation: oriented to person, place, time/date, and situation  Attention: Good  Concentration: Good  Memory: WNL  Fund of knowledge:  Good  Insight:   Good  Judgment:  Good  Impulse Control: Good   Risk Assessment: Danger to Self:  No Patient denied current suicidal ideation  Self-injurious Behavior: No Danger to Others: No Patient denied current homicidal ideation Duty to Warn:no Physical Aggression / Violence:No  Access to Firearms a concern: No  Gang Involvement:No   Subjective:  Patient reported staff changes at work and patient has acquired additional staff members due to changes. Patient reported discord with another supervisor is an additional work related stressor.  Patient reported limiting communication with colleague as a result. Patient stated, I purposely left her out there in reference to recent meeting patient co-facilitated with colleague. Patient stated, that has brought on an added stress, I don't feel like it is a help in reference to conflict with colleague. Patient stated, I think she doesn't see anything wrong with anything she does. Patient stated, I'm really trying to figure that out because it seems like so much of that consumes my week in reference to work related stressors and patient's personal life. Patient stated, It was a little heavy for a week in reference to mood and reported due to stressors.  Patient reported  discussions regarding exchanging gifts is a source of stress and conflict with husband. Patient stated, I think its always this time of year becomes an additional stress. Patient stated, I don't want to be in a space that I'm always feeling a burden where I have to go talk to these people (family members), I'm struggling with it. Patient reported feeling family members are dismissive of patient. Patient stated,  I'm trying to figure out for myself, why is this bothering me so much in reference to family dynamics. Patient stated, its just cause it's never acknowledged, you're (husband) acting in a way that almost feels like it's ok in reference to husband acknowledging family members' behaviors. Patient reported feelings of anger in response and stated, Its probably being made to feel like I'm not priority. Patient stated, I've lost the me and what I like to do. Patient stated, overall I'm fine in response to mood.   Interventions: Cognitive Behavioral Therapy and Interpersonal. Clinician conducted session in person at clinician's office at [facility]. Reviewed events since last session and assessed for changes. Discussed work related stressors, including conflict with colleague, and patient's response. Provided psycho education related to generating alternatives and assisted patient in generating alternatives. Discussed family dynamics during holidays. Explored and identified thoughts and feelings triggered by family dynamics during holidays. Explored feeling dismissed and feelings of anger. Provided psycho education related to effective communication, such as, use of I statements. Provided psycho education related to fair fighting rules. Clinician will review patient's homework to complete best possible self exercise next session.    Collaboration of Care: Other not required at this  time   Diagnosis:  Mild episode of recurrent major depressive disorder (HCC)   Anxiety disorder,  unspecified type     Plan: Patient is to utilize [employer], thought re-framing, healthy communication strategies, mindfulness and coping strategies to decrease symptoms associated with their diagnosis. Frequency: bi-weekly  Modality: individual      Long-term goal:   Identify, challenge, and replace negative core beliefs, thought patterns, and negative self talk that contribute to feelings of depression, anxiety, and decreased self esteem with positive thoughts, beliefs, and positive self talk per patient's report Target Date: 06/02/25  Progress: progressing    Short-term goal:  Identify, challenge, and replace unhelpful thinking patterns/cognitive distortions that contribute to barriers to patient participating in activities of enjoyment Target Date: 06/02/25  Progress: progressing    Implement mindfulness strategies to promote patient's focus on being in the present Target Date: 06/02/25  Progress: progressing    Develop and implement effective communication strategies for patient to utilize to increase patient's ability to express her thoughts and feelings to others to promote healthy communication with others Target Date: 06/02/25  Progress: progressing    Identify and implement healthy coping strategies for patient to utilize to decrease the impact of stress inducing situations Target Date: 06/02/25  Progress: progressing       Darice Seats, LCSW

## 2024-08-29 NOTE — Progress Notes (Signed)
   Darice Seats, LCSW

## 2024-09-12 ENCOUNTER — Ambulatory Visit: Admitting: Clinical

## 2024-09-12 DIAGNOSIS — F419 Anxiety disorder, unspecified: Secondary | ICD-10-CM | POA: Diagnosis not present

## 2024-09-12 DIAGNOSIS — F330 Major depressive disorder, recurrent, mild: Secondary | ICD-10-CM

## 2024-09-12 NOTE — Progress Notes (Signed)
 "  [facility] Counselor/Therapist Progress Note  Patient ID: Jo Goodwin, MRN: [id number],    Date: 09/12/2024  Time Spent: 9:36am - 10:34am : 58 minutes   Treatment Type: Individual Therapy  Reported Symptoms: none reported  Mental Status Exam: Appearance:  Neat and Well Groomed     Behavior: Appropriate  Motor: Normal  Speech/Language:  Clear and Coherent and Normal Rate  Affect: Appropriate  Mood: normal  Thought process: normal  Thought content:   WNL  Sensory/Perceptual disturbances:   WNL  Orientation: oriented to person, place, time/date, and situation  Attention: Good  Concentration: Good  Memory: WNL  Fund of knowledge:  Good  Insight:   Good  Judgment:  Good  Impulse Control: Good   Risk Assessment: Danger to Self:  No Patient denied current suicidal ideation  Self-injurious Behavior: No Danger to Others: No Patient denied current homicidal ideation Duty to Warn:no  Physical Aggression / Violence:No  Access to Firearms a concern: No  Gang Involvement:No   Subjective:  Patient stated, better than I was expecting in reference to recent holidays. Patient reported patient's husband provided support/assistance to patient during a holiday breakfast. Patient reported good for the most part in response to events since last session. Patient reported patient has been off work since last Tuesday and patient stated, I need to do that a lot more often in reference to taking time off from work.  Patient stated, overall good in response to mood since last session. Patient reported being more intentional about taking care of myself in reference to scheduling medical appointments. Patient reported patient's schedule has been a barrier to scheduling medical appointments. Patient stated, that has improved in reference to eating lunch while at work. Patient stated,  I want to become more active in reference to personal domain portion of homework  assignment. Patient reported patient would like to start using exercise equipment at patient's home more consistently. Patient reported patient would like to develop friendships.   Interventions: Cognitive Behavioral Therapy. Clinician conducted session in person at clinician's office at [facility]. Reviewed events since last session and assessed for changes. Discussed patient's experience during recent holidays. Discussed recent time off and change in patient's perspective in response. Reviewed best possible self exercise. Discussed strategies to support patient's goals, such as, scheduling time in calendar for phone calls to make appointments, preparing lunch the night before a work day. Provided psycho education related to opposite action and setting smaller goals. Provided psycho education related to generating alternatives and use of positive self talk. Clinician and patient will continue to review patient's homework to complete best possible self exercise next session.    Collaboration of Care: Other not required at this time   Diagnosis:  Mild episode of recurrent major depressive disorder (HCC)   Anxiety disorder, unspecified type     Plan: Patient is to utilize [employer], thought re-framing, healthy communication strategies, mindfulness and coping strategies to decrease symptoms associated with their diagnosis. Frequency: bi-weekly  Modality: individual      Long-term goal:   Identify, challenge, and replace negative core beliefs, thought patterns, and negative self talk that contribute to feelings of depression, anxiety, and decreased self esteem with positive thoughts, beliefs, and positive self talk per patient's report Target Date: 06/02/25  Progress: progressing    Short-term goal:  Identify, challenge, and replace unhelpful thinking patterns/cognitive distortions that contribute to barriers to patient participating in activities of enjoyment Target  Date: 06/02/25  Progress:  progressing    Implement mindfulness strategies to promote patient's focus on being in the present Target Date: 06/02/25  Progress: progressing    Develop and implement effective communication strategies for patient to utilize to increase patient's ability to express her thoughts and feelings to others to promote healthy communication with others Target Date: 06/02/25  Progress: progressing    Identify and implement healthy coping strategies for patient to utilize to decrease the impact of stress inducing situations Target Date: 06/02/25  Progress: progressing         Darice Seats, LCSW    "

## 2024-09-12 NOTE — Progress Notes (Signed)
   Darice Seats, LCSW

## 2024-09-29 ENCOUNTER — Ambulatory Visit: Payer: Self-pay | Admitting: Nurse Practitioner

## 2024-09-29 ENCOUNTER — Ambulatory Visit: Admitting: Nurse Practitioner

## 2024-09-29 ENCOUNTER — Encounter: Payer: Self-pay | Admitting: Nurse Practitioner

## 2024-09-29 ENCOUNTER — Ambulatory Visit (INDEPENDENT_AMBULATORY_CARE_PROVIDER_SITE_OTHER): Admission: RE | Admit: 2024-09-29 | Source: Ambulatory Visit

## 2024-09-29 VITALS — BP 124/86 | HR 77 | Temp 98.0°F | Ht 67.5 in | Wt 261.2 lb

## 2024-09-29 DIAGNOSIS — M7989 Other specified soft tissue disorders: Secondary | ICD-10-CM

## 2024-09-29 DIAGNOSIS — R3129 Other microscopic hematuria: Secondary | ICD-10-CM

## 2024-09-29 DIAGNOSIS — R1030 Lower abdominal pain, unspecified: Secondary | ICD-10-CM | POA: Diagnosis not present

## 2024-09-29 DIAGNOSIS — L299 Pruritus, unspecified: Secondary | ICD-10-CM

## 2024-09-29 LAB — POCT URINALYSIS DIPSTICK
Bilirubin, UA: NEGATIVE
Blood, UA: POSITIVE
Glucose, UA: NEGATIVE
Ketones, UA: NEGATIVE
Leukocytes, UA: NEGATIVE
Nitrite, UA: NEGATIVE
Protein, UA: NEGATIVE
Spec Grav, UA: 1.02
Urobilinogen, UA: 0.2 U/dL
pH, UA: 6

## 2024-09-29 MED ORDER — LEVOCETIRIZINE DIHYDROCHLORIDE 5 MG PO TABS: 5.0000 mg | ORAL_TABLET | Freq: Every evening | ORAL | 0 refills | Status: AC

## 2024-09-29 NOTE — Progress Notes (Signed)
 "  Established Patient Office Visit  Subjective   Patient ID: Jo Goodwin, female    DOB: Sep 25, 1975  Age: 49 y.o. MRN: [id number]  Chief Complaint  Patient presents with   Abdominal Pain    Pt complains of pain in lower abdominal area yesterday. Pt states she felt compacted. Pt did an enema and constipation pills. States the pain has eased but is still sore when applying pressure.    swelling    Pt complains of hand swelling. States of itchiness and complains of bumps that are getting bigger in fingers.       With a history  GERD, GAD, HTN Discussed the use of AI scribe software for clinical note transcription with the patient, who gave verbal consent to proceed.  History of Present Illness Jo Goodwin is a 49 year old female who presents with abdominal pain and hand swelling with itching.  She has been experiencing constant lower abdominal discomfort since the night before last, which became noticeable after eating. The pain is located in the lower abdomen. She attempted to relieve the discomfort with an enema and an over-the-counter medication, which was prescribed by her spouses doctor. She also drinks prune juice regularly. Despite these measures, she reports watery bowel movements and persistent bloating and tenderness. No associated nausea, vomiting, or urinary symptoms beyond her usual frequency due to prolapse.  She reports swelling and itching in both hands, with the development of small, intensely itchy bumps that have increased in size over the past two to three weeks. The swelling makes it difficult to wear rings, and her job involves frequent use of her hands. She denies joint pain but mentions occasional stiffness. She has a history of breaking out in hives, which she attributes to stress, and she does not regularly take her prescribed hydroxyzine  for anxiety or itching.     Review of Systems  Constitutional:  Negative for chills and fever.  Respiratory:   Negative for shortness of breath.   Cardiovascular:  Negative for chest pain.  Gastrointestinal:  Positive for abdominal pain and constipation. Negative for diarrhea, nausea and vomiting.  Genitourinary:  Negative for dysuria, frequency, hematuria and urgency.      Objective:     BP 124/86   Pulse 77   Temp 98 F (36.7 C) (Oral)   Ht 5' 7.5 (1.715 m)   Wt 261 lb 3.2 oz (118.5 kg)   LMP 11/02/2018   SpO2 99%   BMI 40.31 kg/m  BP Readings from Last 3 Encounters:  09/29/24 124/86  07/12/24 118/80  01/27/23 (!) 134/90   Wt Readings from Last 3 Encounters:  09/29/24 261 lb 3.2 oz (118.5 kg)  07/12/24 263 lb 12.8 oz (119.7 kg)  01/27/23 258 lb (117 kg)   SpO2 Readings from Last 3 Encounters:  09/29/24 99%  07/12/24 96%  01/27/23 100%      Physical Exam Vitals and nursing note reviewed.  Constitutional:      Appearance: Normal appearance.  Cardiovascular:     Rate and Rhythm: Normal rate and regular rhythm.     Heart sounds: Normal heart sounds.  Pulmonary:     Effort: Pulmonary effort is normal.     Breath sounds: Normal breath sounds.  Abdominal:     General: Bowel sounds are normal. There is no distension.     Palpations: There is no mass.     Tenderness: There is abdominal tenderness. There is no right CVA tenderness or left CVA tenderness.  Hernia: No hernia is present.  Musculoskeletal:       Arms:  Neurological:     Mental Status: She is alert.      Results for orders placed or performed in visit on 09/29/24  POCT urinalysis dipstick  Result Value Ref Range   Color, UA yellow    Clarity, UA clear    Glucose, UA Negative Negative   Bilirubin, UA neg    Ketones, UA neg    Spec Grav, UA 1.020 1.010 - 1.025   Blood, UA pos    pH, UA 6.0 5.0 - 8.0   Protein, UA Negative Negative   Urobilinogen, UA 0.2 0.2 or 1.0 E.U./dL   Nitrite, UA neg    Leukocytes, UA Negative Negative   Appearance     Odor        The 10-year ASCVD risk score  (Arnett DK, et al., 2019) is: 3.8%    Assessment & Plan:   Problem List Items Addressed This Visit   None Visit Diagnoses       Pruritus    -  Primary   Relevant Medications   levocetirizine (XYZAL ) 5 MG tablet     Hand swelling       Relevant Orders   DG Hand 2 View Left     Lower abdominal pain       Relevant Orders   POCT urinalysis dipstick (Completed)   Urine Culture   DG Abd 1 View (Completed)     Assessment and Plan Assessment & Plan Lower abdominal pain with constipation Persistent lower abdominal pain with watery frequent bowel movements. Differential includes constipation and possible UTI. Urinalysis showed hematuria; culture pending. - Ordered abdominal imaging to assess for constipation or other abnormalities. - Sent urine for culture to rule out urinary tract infection. - Provided recommendations for bowel cleansing if imaging indicates constipation.  Hand swelling and urticaria Swelling and itching of hands with nodules. Differential includes dyshidrotic eczema. Stress and heat may contribute. Daily antihistamine prescribed. - Ordered imaging of the hand to visualize nodules. - Prescribed daily antihistamine to manage urticaria and itching. - Advised taking antihistamine before bed to avoid potential drowsiness.   Return if symptoms worsen or fail to improve.    Adina Crandall, NP  "

## 2024-09-29 NOTE — Patient Instructions (Signed)
 Nice to see you today I will be in touch with the xray once I have them Follow up if you do not improve

## 2024-09-30 LAB — URINE CULTURE
MICRO NUMBER:: 17479245
Result:: NO GROWTH
SPECIMEN QUALITY:: ADEQUATE

## 2024-10-11 NOTE — Addendum Note (Signed)
 Addended by: WENDEE LYNWOOD HERO on: 10/11/2024 07:53 AM   Modules accepted: Orders

## 2024-10-11 NOTE — Telephone Encounter (Signed)
 Referral placed

## 2024-10-11 NOTE — Telephone Encounter (Signed)
-----   Message from [facility] T sent at 10/06/2024  3:45 PM EST ----- Called patient reviewed all information and repeated back to me. Will call if any questions.   Pt states she prefers either location. Says it doesn't matter.  ----- Message ----- From: Wendee Lynwood HERO, NP Sent: 10/06/2024   7:11 AM EST To: Wendee Gunnels  Notified via My Chart   See below

## 2024-10-12 ENCOUNTER — Ambulatory Visit: Admitting: Clinical

## 2024-10-12 DIAGNOSIS — F330 Major depressive disorder, recurrent, mild: Secondary | ICD-10-CM | POA: Diagnosis not present

## 2024-10-12 DIAGNOSIS — F419 Anxiety disorder, unspecified: Secondary | ICD-10-CM | POA: Diagnosis not present

## 2024-10-12 NOTE — Progress Notes (Signed)
 "  Jo Goodwin Counselor/Therapist Progress Note  Patient ID: Jo Goodwin, MRN: [id number],    Date: 10/12/2024  Time Spent: 9:35am - 10:38am : 63 minutes   Treatment Type: Individual Therapy  Reported Symptoms: decreased energy, motivation, and loss of interest  Mental Status Exam: Appearance:  Neat and Well Groomed     Behavior: Appropriate  Motor: Normal  Speech/Language:  Clear and Coherent and Normal Rate  Affect: Appropriate  Mood: depressed  Thought process: normal  Thought content:   WNL  Sensory/Perceptual disturbances:   WNL  Orientation: oriented to person, place, time/date, and situation  Attention: Good  Concentration: Good  Memory: WNL  Fund of knowledge:  Good  Insight:   Good  Judgment:  Good  Impulse Control: Good   Risk Assessment: Danger to Self:  No Patient denied current suicidal ideation  Self-injurious Behavior: No Danger to Others: No Patient denied current homicidal ideation Duty to Warn:no Physical Aggression / Violence:No  Access to Firearms a concern: No  Gang Involvement:No   Subjective:  Patient reported good in response to events since last session and reported no changes in stressors. Patient stated, I guess ok in response to mood since last session. Patient reported I just feel like I'm going through the motions. Patient reported not having the interest to do the things I have to do. Patient stated, the energy is definitely off in reference to patient's energy level. Patient stated, It doesn't feel like myself in reference to symptoms. Patient stated, it's very little in response to time for self care/personal time each day. Patient stated,I feel like I'm missing a lot more of the laughter and the fun, I feel like that side of me has disappeared. Patient stated, sometimes I feel like I've checked out of work. Patient reported consistent changes since patient started current job. Patient reported feeling  patient has been experiencing burnout and reported thoughts, Is this giving me what I need in reference to work. Patient reported patient enjoys to make crafty stuff. Patient stated,  I'm ready for a change in reference to patient's job. Patient reported finances are a barrier to job opportunities that align with patient's interests. .   Interventions: Cognitive Behavioral Therapy. Clinician conducted session in person at clinician's office at [facility]. Reviewed events since last session and assessed for changes. Discussed status of work related stressors. Reviewed current symptoms and provided psycho education related to depressive symptoms. Provided psycho education related to positive activities and the benefits. Explored impact of work related stressors on patient's mood and personal life. Discussed burnout and explored patient's experience. Reviewed homework assignment and explored professional domain of best possible self exercise. Explored professional opportunities that incorporate creativity and teaching. Clinician and patient will continue to review patient's homework to complete best possible self exercise next session.     Collaboration of Care: Other not required at this time   Diagnosis:  Mild episode of recurrent major depressive disorder (HCC)   Anxiety disorder, unspecified type     Plan: Patient is to utilize [employer], thought re-framing, healthy communication strategies, mindfulness and coping strategies to decrease symptoms associated with their diagnosis. Frequency: bi-weekly  Modality: individual      Long-term goal:   Identify, challenge, and replace negative core beliefs, thought patterns, and negative self talk that contribute to feelings of depression, anxiety, and decreased self esteem with positive thoughts, beliefs, and positive self talk per patient's report Target Date: 06/02/25  Progress: progressing  Short-term goal:   Identify, challenge, and replace unhelpful thinking patterns/cognitive distortions that contribute to barriers to patient participating in activities of enjoyment Target Date: 06/02/25  Progress: progressing    Implement mindfulness strategies to promote patient's focus on being in the present Target Date: 06/02/25  Progress: progressing    Develop and implement effective communication strategies for patient to utilize to increase patient's ability to express her thoughts and feelings to others to promote healthy communication with others Target Date: 06/02/25  Progress: progressing    Identify and implement healthy coping strategies for patient to utilize to decrease the impact of stress inducing situations Target Date: 06/02/25  Progress: progressing       Darice Seats, LCSW    "

## 2024-10-12 NOTE — Progress Notes (Signed)
   Darice Seats, LCSW

## 2024-10-19 ENCOUNTER — Ambulatory Visit

## 2024-10-24 ENCOUNTER — Ambulatory Visit

## 2024-10-31 ENCOUNTER — Ambulatory Visit: Admitting: Clinical

## 2024-11-21 ENCOUNTER — Ambulatory Visit: Admitting: Clinical
